# Patient Record
Sex: Male | Born: 1940 | Race: White | Hispanic: No | Marital: Married | Smoking: Former smoker
Health system: Southern US, Community
[De-identification: ages and names within clinical notes are randomized; demographics above are authoritative.]

## PROBLEM LIST (undated history)

## (undated) DIAGNOSIS — L570 Actinic keratosis: Secondary | ICD-10-CM

## (undated) DIAGNOSIS — K219 Gastro-esophageal reflux disease without esophagitis: Secondary | ICD-10-CM

## (undated) DIAGNOSIS — I4891 Unspecified atrial fibrillation: Secondary | ICD-10-CM

## (undated) DIAGNOSIS — M1990 Unspecified osteoarthritis, unspecified site: Secondary | ICD-10-CM

## (undated) DIAGNOSIS — I2510 Atherosclerotic heart disease of native coronary artery without angina pectoris: Secondary | ICD-10-CM

## (undated) DIAGNOSIS — M519 Unspecified thoracic, thoracolumbar and lumbosacral intervertebral disc disorder: Secondary | ICD-10-CM

## (undated) DIAGNOSIS — R519 Headache, unspecified: Secondary | ICD-10-CM

## (undated) DIAGNOSIS — K2100 Gastro-esophageal reflux disease with esophagitis, without bleeding: Secondary | ICD-10-CM

## (undated) DIAGNOSIS — I10 Essential (primary) hypertension: Secondary | ICD-10-CM

## (undated) HISTORY — DX: Actinic keratosis: L57.0

## (undated) HISTORY — PX: AMPUTATION ARM: SHX6593

## (undated) HISTORY — PX: TOTAL HIP ARTHROPLASTY: SHX124

## (undated) HISTORY — PX: CATARACT EXTRACTION: SUR2

## (undated) HISTORY — PX: COLONOSCOPY: SHX174

## (undated) HISTORY — PX: CHOLECYSTECTOMY: SHX55

---

## 1999-09-20 ENCOUNTER — Encounter: Payer: Self-pay | Admitting: Neurology

## 1999-09-20 ENCOUNTER — Encounter: Admission: RE | Admit: 1999-09-20 | Discharge: 1999-12-19 | Payer: Self-pay | Admitting: Neurology

## 1999-09-20 ENCOUNTER — Ambulatory Visit (HOSPITAL_COMMUNITY): Admission: RE | Admit: 1999-09-20 | Discharge: 1999-09-20 | Payer: Self-pay | Admitting: Neurology

## 1999-11-01 ENCOUNTER — Encounter: Payer: Self-pay | Admitting: Neurosurgery

## 1999-11-05 ENCOUNTER — Encounter: Payer: Self-pay | Admitting: Neurosurgery

## 1999-11-05 ENCOUNTER — Ambulatory Visit (HOSPITAL_COMMUNITY): Admission: RE | Admit: 1999-11-05 | Discharge: 1999-11-06 | Payer: Self-pay | Admitting: Neurosurgery

## 2004-12-08 ENCOUNTER — Emergency Department (HOSPITAL_COMMUNITY): Admission: EM | Admit: 2004-12-08 | Discharge: 2004-12-08 | Payer: Self-pay | Admitting: Emergency Medicine

## 2006-02-25 ENCOUNTER — Ambulatory Visit: Payer: Self-pay | Admitting: Gastroenterology

## 2006-04-21 ENCOUNTER — Ambulatory Visit: Payer: Self-pay | Admitting: Internal Medicine

## 2006-11-18 ENCOUNTER — Inpatient Hospital Stay (HOSPITAL_COMMUNITY): Admission: RE | Admit: 2006-11-18 | Discharge: 2006-11-19 | Payer: Self-pay | Admitting: Neurosurgery

## 2007-03-03 ENCOUNTER — Ambulatory Visit: Payer: Self-pay | Admitting: Neurosurgery

## 2007-05-26 ENCOUNTER — Inpatient Hospital Stay (HOSPITAL_COMMUNITY): Admission: RE | Admit: 2007-05-26 | Discharge: 2007-05-29 | Payer: Self-pay | Admitting: Orthopaedic Surgery

## 2007-05-27 ENCOUNTER — Ambulatory Visit: Payer: Self-pay | Admitting: Physical Medicine & Rehabilitation

## 2008-02-23 ENCOUNTER — Encounter: Admission: RE | Admit: 2008-02-23 | Discharge: 2008-02-23 | Payer: Self-pay | Admitting: Neurosurgery

## 2009-06-14 ENCOUNTER — Ambulatory Visit (HOSPITAL_COMMUNITY): Admission: RE | Admit: 2009-06-14 | Discharge: 2009-06-14 | Payer: Self-pay | Admitting: Orthopaedic Surgery

## 2009-07-05 ENCOUNTER — Ambulatory Visit: Payer: Self-pay | Admitting: Gastroenterology

## 2010-08-24 ENCOUNTER — Encounter (HOSPITAL_COMMUNITY): Payer: Medicare Other | Attending: Orthopedic Surgery

## 2010-08-24 LAB — CBC
Platelets: 155 10*3/uL (ref 150–400)
RBC: 3.98 MIL/uL — ABNORMAL LOW (ref 4.22–5.81)
WBC: 4.6 10*3/uL (ref 4.0–10.5)

## 2010-08-24 LAB — BASIC METABOLIC PANEL
BUN: 18 mg/dL (ref 6–23)
Chloride: 104 mEq/L (ref 96–112)
Creatinine, Ser: 0.86 mg/dL (ref 0.4–1.5)
Glucose, Bld: 104 mg/dL — ABNORMAL HIGH (ref 70–99)

## 2010-08-24 LAB — DIFFERENTIAL
Basophils Absolute: 0 10*3/uL (ref 0.0–0.1)
Basophils Relative: 0 % (ref 0–1)
Eosinophils Absolute: 0.1 10*3/uL (ref 0.0–0.7)
Neutrophils Relative %: 57 % (ref 43–77)

## 2010-08-24 LAB — URINALYSIS, ROUTINE W REFLEX MICROSCOPIC
Ketones, ur: NEGATIVE mg/dL
Nitrite: NEGATIVE
Specific Gravity, Urine: 1.021 (ref 1.005–1.030)
Urobilinogen, UA: 0.2 mg/dL (ref 0.0–1.0)
pH: 6.5 (ref 5.0–8.0)

## 2010-08-24 LAB — APTT: aPTT: 33 seconds (ref 24–37)

## 2010-08-24 LAB — PROTIME-INR: INR: 1.11 (ref 0.00–1.49)

## 2010-08-24 LAB — SURGICAL PCR SCREEN: Staphylococcus aureus: NEGATIVE

## 2010-09-04 ENCOUNTER — Inpatient Hospital Stay (HOSPITAL_COMMUNITY): Payer: Medicare Other

## 2010-09-04 ENCOUNTER — Inpatient Hospital Stay (HOSPITAL_COMMUNITY)
Admission: RE | Admit: 2010-09-04 | Discharge: 2010-09-06 | DRG: 468 | Disposition: A | Discharge status: Home Health | Payer: Medicare Other | Attending: Orthopedic Surgery | Admitting: Orthopedic Surgery

## 2010-09-04 DIAGNOSIS — R7989 Other specified abnormal findings of blood chemistry: Secondary | ICD-10-CM | POA: Diagnosis present

## 2010-09-04 DIAGNOSIS — M1610 Unilateral primary osteoarthritis, unspecified hip: Secondary | ICD-10-CM | POA: Diagnosis present

## 2010-09-04 DIAGNOSIS — Y831 Surgical operation with implant of artificial internal device as the cause of abnormal reaction of the patient, or of later complication, without mention of misadventure at the time of the procedure: Secondary | ICD-10-CM | POA: Diagnosis present

## 2010-09-04 DIAGNOSIS — M169 Osteoarthritis of hip, unspecified: Secondary | ICD-10-CM | POA: Diagnosis present

## 2010-09-04 DIAGNOSIS — Y92009 Unspecified place in unspecified non-institutional (private) residence as the place of occurrence of the external cause: Secondary | ICD-10-CM

## 2010-09-04 DIAGNOSIS — T84099A Other mechanical complication of unspecified internal joint prosthesis, initial encounter: Principal | ICD-10-CM | POA: Diagnosis present

## 2010-09-04 DIAGNOSIS — Z96649 Presence of unspecified artificial hip joint: Secondary | ICD-10-CM

## 2010-09-04 DIAGNOSIS — I10 Essential (primary) hypertension: Secondary | ICD-10-CM | POA: Diagnosis present

## 2010-09-04 LAB — TYPE AND SCREEN

## 2010-09-05 LAB — BASIC METABOLIC PANEL
CO2: 25 mEq/L (ref 19–32)
Chloride: 108 mEq/L (ref 96–112)
Creatinine, Ser: 0.78 mg/dL (ref 0.4–1.5)
GFR calc Af Amer: >60 mL/min (ref >60)
Potassium: 3.8 mEq/L (ref 3.5–5.1)
Sodium: 140 mEq/L (ref 135–145)

## 2010-09-05 LAB — CBC
Hemoglobin: 12.2 g/dL — ABNORMAL LOW (ref 13.0–17.0)
MCH: 31.4 pg (ref 26.0–34.0)
Platelets: 157 10*3/uL (ref 150–400)
RBC: 3.88 MIL/uL — ABNORMAL LOW (ref 4.22–5.81)
WBC: 8.6 10*3/uL (ref 4.0–10.5)

## 2010-09-06 LAB — BASIC METABOLIC PANEL
BUN: 15 mg/dL (ref 6–23)
CO2: 27 mEq/L (ref 19–32)
Chloride: 109 mEq/L (ref 96–112)
Creatinine, Ser: 0.95 mg/dL (ref 0.4–1.5)
Glucose, Bld: 118 mg/dL — ABNORMAL HIGH (ref 70–99)
Potassium: 3.9 mEq/L (ref 3.5–5.1)

## 2010-09-06 LAB — CBC
HCT: 32 % — ABNORMAL LOW (ref 39.0–52.0)
Hemoglobin: 11.2 g/dL — ABNORMAL LOW (ref 13.0–17.0)
MCH: 32.2 pg (ref 26.0–34.0)
MCV: 92 fL (ref 78.0–100.0)
RBC: 3.48 MIL/uL — ABNORMAL LOW (ref 4.22–5.81)
WBC: 9.1 10*3/uL (ref 4.0–10.5)

## 2010-09-10 NOTE — Op Note (Signed)
Bradley, Moran               ACCOUNT NO.:  [id number]  MEDICAL RECORD NO.:  [id number]           PATIENT TYPE:  I  LOCATION:  1601                         FACILITY:  [facility], [place]  PHYSICIAN:  Madlyn Frankel. Charlann Boxer, M.D.  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  FINDINGS:  There was evidence of the significant joint effusion.  No signs of infection, however.  There was also some corrosion issues going on with the trunnion.  PROCEDURE:  Revision right total hip replacement utilizing a 58 pinnacle Gription cup, 2 cancellous bone screws of  36 plus 4 10-degrees AltrX liner with a 36 plus 12 aSphere ball.  SURGEON:  Madlyn Frankel. Charlann Boxer, M.D.  ASSISTANT:  Nelia Shi. Webb Silversmith, RN  ANESTHESIA:  General.  ESTIMATED BLOOD LOSS:  300 cc.  SPECIMENS:  None.  DRAINS:  One Hemovac.  INDICATIONS FOR PROCEDURE:  [name] is a 70 year old gentleman who presented for evaluation of right hip pain following a total hip replacement with an ASR component.  Serial lab work had indicated elevating serum cobalt chromium levels.  MRI revealed joint effusion and fluid around the hip joint.  After reviewing with him the concerns of these issues as well as the discomfort in the hip, he wished to at this point proceed with revision hip surgery.  Risks of infection, DVT, component failure, dislocation, need for further surgery were all reviewed and discussed for the benefit of pain relief and removal of the metal-on-metal component.  PROCEDURE IN DETAIL:  The patient was brought to the operative theater. Once adequate anesthesia, preoperative antibiotics, Ancef  administered, the  patient was positioned in the left lateral decubitus position with his right side up.  The right lower extremity was then prepped and draped in a sterile fashion.  A time-out was performed identifying the patient, planned procedure and extremity.  The patient's old incision was identified and marked out.  The incision was made.  Sharp dissection was carried through iliotibial band and gluteal fascia.  This was then incised posteriorly.  Once I got to the iliotibial band and gluteal fascia, I encountered this joint fluid that was just metal stained, there was no sign of inflammation.  No signs of purulence.  Following the soft tissue debridement, synovectomy and removal of the synovial lining, the hip was dislocated and the femoral head removed. Further exposure of the acetabular capsular tissues and allow for exposure allowed me to elevate the femoral head neck trunnion up on to the ilium.  I then used osteotomes from the [facility] cementless revision set to remove the acetabular cup.  The 56-mm cup that was removed, I just touch the acetabular 57 reamer and chose a 58 cup.  The 58 Gription cup was then impacted.  I did end up using a bone tamp to set the orientation and forward flex  beneath the anterior rim and abducted at an appropriate angle.  Once I had this then I used large ball impactor and impacted so the cup was well seated.  Though there was some initial scratch fit, I did rely on 2 iliac screws that were placed and had excellent fixation.  At this point, I did a trial reduction first placing a neutral liner with 36 plus 5 ball.  He had significant shuck at this point with evidence of impingement and early subluxation.  I went ahead and chose a 36 plus 4 10-degrees face changing liner and following placement of a hole eliminator impacted this into position with the lip liner positioned at approximately 9 o'clock for the right hip.  I then retrialed and ended up choosing a  36 plus 12 ball from a revision standpoint to provide as much stability to the hip as possible.  The final 36 plus 12 aSphere ball was chosen and impacted on to a clean and dry trunnion.  At this point, the hip was reduced and even irrigated throughout the case and again at this point.  I reapproximated posterior pseudo capsule to the superior pseudo capsule and then placed a medium Hemovac drain deep.  At this point, the iliotibial band and gluteal fascia were then reapproximated using #1 Vicryl.  The remaining wound was closed with 2-0 Vicryl and running 3-0 Monocryl.  The hip was cleaned, dried, and dressed sterilely with octylseal sealant as well as an Aquacel dressing and drain site was dressed separately.  He was then brought to recovery room in stable condition tolerating the procedure well.     Madlyn Frankel Charlann Boxer, M.D.     MDO/MEDQ  D:  09/04/2010  T:  09/04/2010  Job:  [id number]  Electronically Signed by Durene Romans M.D. on 09/10/2010 10:50:17 AM

## 2010-10-12 NOTE — H&P (Signed)
  [name], FREDELL               ACCOUNT NO.:  [id number]  MEDICAL RECORD NO.:  [id number]           PATIENT TYPE:  I  LOCATION:  PADM                         FACILITY:  [facility]  PHYSICIAN:  Madlyn Frankel. Charlann Boxer, M.D.  DATE OF BIRTH:  01-08-1941  DATE OF ADMISSION:  08/24/2010 DATE OF DISCHARGE:                             HISTORY & PHYSICAL   ADMISSION DIAGNOSIS:  Right hip arthroplasty failure.  BRIEF HISTORY:  This is a patient whom I saw hip, who has failed components and will need revision and is being admitted for such on September 04, 2010.  PAST MEDICAL HISTORY:  Significant for corrective surgery, but he does wear reading glasses.  He has upper dentures.  Otherwise, he is healthy. He has occasional GERD and history of low back pain and osteoarthritis.  CURRENT MEDICATIONS:  Omeprazole daily and Naprosyn as needed.  ALLERGIES:  He has no medicine allergies.  SOCIAL HISTORY:  The patient is married.  He is retired.  He has a history of tobacco use.  No history of alcohol or street drug abuse.  He has 1 children.  DISPOSITION PLAN:  Home.  FAMILY HISTORY:  Father had stroke, died at 78.  His mother died of lung and kidney disease at 71 years old.  REVIEW OF SYSTEMS:  Notable for those difficulties described in history of present illness, past medical history.  His review of systems sheet is otherwise unremarkable.  PHYSICAL EXAMINATION:  VITAL SIGNS:  The patient is 6 feet, 1 inch; 240 pounds.  His blood pressure today is 130/72, his respirations are 18, his pulse is 72. GENERAL:  Health is good. HEENT:  Shows him to be normocephalic with glasses, corrective surgery and upper dentures. CHEST:  Unremarkable.  Clear to auscultation bilaterally. HEART:  S1-S2.  There are no murmurs, rubs, or gallops. ABDOMEN:  Soft and nondistended.  He does have history of GERD. GI/GU:  Otherwise unremarkable. EXTREMITIES:  Osteoarthritis and low back pain. DERMATOLOGIC:  He is  intact. NEUROLOGIC:  He is intact.  LABORATORY DATA:  He just finished his labs, EKG, and chest x-ray at [facility].  IMPRESSION:  Right hip total arthroplasty failure.  PLAN:  Revision on September 04, 2010, with Dr. Charlann Boxer. His discharge medications include Xarelto, Robaxin, MiraLax, Colace were given to him today.  His pain medicines will be given to him at discharge.     [name], RN   ______________________________ Madlyn Frankel Charlann Boxer, M.D.    RLW/MEDQ  D:  08/24/2010  T:  08/24/2010  Job:  [id number]  Electronically Signed by Lauree Chandler NP-C on 08/29/2010 09:44:25 AM Electronically Signed by Durene Romans M.D. on 09/02/2010 09:17:05 AM

## 2010-10-12 NOTE — Discharge Summary (Signed)
  NAMEBRONC, [name]               ACCOUNT NO.:  [id number]  MEDICAL RECORD NO.:  [id number]           PATIENT TYPE:  I  LOCATION:  PADM                         FACILITY:  [facility]  PHYSICIAN:  Madlyn Frankel. Charlann Boxer, M.D.  DATE OF BIRTH:  12-03-1940  DATE OF ADMISSION:  08/24/2010 DATE OF DISCHARGE:  08/24/2010                              DISCHARGE SUMMARY   ADMITTING DIAGNOSIS:  Right hip arthroplasty failure.  BRIEF HISTORY:  The patient was seen in evaluation in February for pain in the right hip due to component failure and decided to proceed with arthroplasty revision.  HOSPITAL COURSE:  The patient was admitted through Same-Day Surgery on the 14th.  He was taken to the operating theater and underwent the revision without any difficulty.  He was taken to the PACU for recovery and brought to [facility] for further recovery and rehabilitation.  Since that time, he has advanced his diet to regular.  He has been up with physical therapy.  He is 25% to 50% weightbearing until followup with Dr. Charlann Boxer.  Today, his vital signs are stable, his labs are stable.  Yesterday, his hemoglobin was 12.2 and hematocrit was 34.6.  His wound is clean and dry.  Discharge instructions were given.  He understands that he is 25% weightbearing with home health physical therapy.  He will be transported home through non-emergent EMS due to stairs at his home.  He has elected to go home instead of a facility for rehabilitation.  DISCHARGE CONDITION:  Good.  DISCHARGE DIAGNOSES: 1. Right hip arthroplasty failure with revision. 2. Corrective eye surgery. 3. Upper dentures. 4. History of gastroesophageal reflux disease. 5. Low back pain.  He is otherwise healthy.  His discharge medications are as follows: 1. Acetaminophen 325 mg as needed. 2. Colace 100 mg as needed. 3. Ferrous sulfate 325 mg 3 times a day for 3 weeks. 4. Dilaudid 2-4 mg p.o. q.4-6 hours p.r.n. pain. 5. Robaxin 500 mg every 6 hours as  needed. 6. MiraLax 17 g a day as needed. 7. Xarelto 10 mg a day for 10 days. 8. Artificial tears as needed. 9. Durezol 0.05% drops in eyes daily. 10.Naprosyn as needed. 11.Nevanac 1% in the left eye 4 times daily. 12.Ofloxacin 0.3% left eye 4 times daily. 13.Omeprazole 20 mg every day as needed.     [name], RN   ______________________________ Madlyn Frankel Charlann Boxer, M.D.    RLW/MEDQ  D:  09/06/2010  T:  09/07/2010  Job:  [id number]  Electronically Signed by Lauree Chandler NP-C on 09/13/2010 01:21:30 PM Electronically Signed by Durene Romans M.D. on 09/14/2010 07:02:09 AM

## 2010-12-04 NOTE — Op Note (Signed)
NAMEFRIEDRICH, [name]               ACCOUNT NO.:  [id number]   MEDICAL RECORD NO.:  [id number]          PATIENT TYPE:  INP   LOCATION:  2899                         FACILITY:  [facility]   PHYSICIAN:  Lubertha Basque. Dalldorf, M.D.DATE OF BIRTH:  03-18-1941   DATE OF PROCEDURE:  05/26/2007  DATE OF DISCHARGE:                               OPERATIVE REPORT   PREOPERATIVE DIAGNOSIS:  Right hip degenerative arthritis.   POSTOPERATIVE DIAGNOSIS:  Right hip degenerative arthritis.   PROCEDURE:  Right total hip replacement.   ANESTHESIA:  General.   ATTENDING SURGEON:  Lubertha Basque. Jerl Santos, M.D.   ASSISTANT:  Lindwood Qua, P.A.-C.   INDICATIONS FOR PROCEDURE:  The patient is a 70 year old male with a  long history of degenerative arthritis of his hips.  He has failed many  oral anti-inflammatories and walking appliances.  He is status post an  opposite side hip replacement years ago which was complicated by  dislocations and required some revision operations.  On the right, he  has pain which limits his ability to rest and walk and he is offered a  replacement with bone-on-bone degeneration seen on the x-rays.  Informed  operative consent was obtained after a discussion of the possible  complications of reaction to anesthesia, infection, DVT, PE,  dislocation, and death.   SUMMARY OF FINDINGS AND PROCEDURE:  Under general anesthesia through a  posterior approach, a right total hip replacement was performed.  He had  advanced degenerative change but excellent bone quality.  We addressed  this problem with a size 7 high offset Summit porous coated DePuy stem  with a +2 size 49 ASR head.  In the cup, we placed a size 56 ASR shell.  Bryna Colander assisted throughout and was invaluable to the completion  of the case in that he helped position and retract while I performed  procedure.  He also closed simultaneously to help minimize OR time.   DESCRIPTION OF PROCEDURE:  The patient was taken to the  operating suite  where a general anesthetic was applied without difficulty.  He was  positioned in the lateral decubitus position with the right hip up.  All  bony prominences were appropriately padded and an axillary roll was  placed and the hip positioners utilized.  He was prepped and draped in a  normal sterile fashion.  After the administration of IV Kefzol, a  posterior approach was taken to the right hip.  All appropriate anti-  infective measures were used including the preoperative IV antibiotic,  Betadine impregnated drape, and closed hooded exhaust systems for each  member of the surgical team.  An incision was made with the dissection  through a generous amount of adipose tissue to expose the IT band and  gluteus maximus fascia.  These structures were incised longitudinally to  expose the short external rotators of the hip which were tagged and  reflected.  A posterior capsulectomy was performed and the hip was  dislocated.  A femoral neck cut was made just above the lesser  trochanter.  The acetabulum was exposed with Cobra retractors.  Some  residual labral tissues were removed.  The reaming was taken medially to  the inside wall of the pelvis and sequentially brought up to size 55  followed by placement of a size 56 ASR shell in appropriate anteversion  and tilt.  The femur was then exposed.  The canal was reamed followed by  sequential broaching up to a size 7 which seemed to fit the best to give  [place] good rotational control and proximal tilt.  We then capped this with  a +2, 49 high offset assembly and the hip was stable in extension with  external rotation and flexion with internal rotation.  Leg lengths were  also judged to be roughly equal.  The trial components were removed  followed by placement of a size 7 high offset Summit stem capped with a  +2, 49 ASR ball.  The hip was reduced and, again, was stable.  The wound  was irrigated followed by reapproximation of the  short external rotators  to the greater trochanteric region with nonabsorbable suture.  The IT  band and gluteus maximus fascia were reapproximated with #1 Vicryl in an  interrupted fashion followed by subcutaneous reapproximation with 0 and  2-0 undyed Vicryl and skin closure with staples.  Adaptic was applied  followed by dry gauze and tape.  Estimated blood loss and intraoperative  fluids can be obtained from anesthesia records.   DISPOSITION:  The patient was extubated in operating room and taken to  the recovery room in stable addition.  He was to be admitted to the  orthopedic surgery service for appropriate postop care to include  perioperative antibiotics and Coumadin plus Lovenox for DVT prophylaxis.      Lubertha Basque Jerl Santos, M.D.  Electronically Signed     PGD/MEDQ  D:  05/26/2007  T:  05/26/2007  Job:  [id number]
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
## 2010-12-07 NOTE — Op Note (Signed)
NAMEWILFRED, [name]               ACCOUNT NO.:  [id number]   MEDICAL RECORD NO.:  [id number]          PATIENT TYPE:  INP   LOCATION:  2899                         FACILITY:  [facility]   PHYSICIAN:  Payton Doughty, M.D.      DATE OF BIRTH:  1941/07/05   DATE OF PROCEDURE:  11/18/2006  DATE OF DISCHARGE:                               OPERATIVE REPORT   PREOPERATIVE DIAGNOSIS:  Spondylosis, L3-4.   POSTOPERATIVE DIAGNOSIS:  Spondylosis, L3-4.   PROCEDURE:  L3-4 laminotomy, foraminotomy done bilaterally.   SURGEON:  Payton Doughty, M.D.   SERVICE:  Neurosurgery.   ANESTHESIA:  General endotracheal   PREP:  Betadine prep with alcohol wipe.   COMPLICATIONS:  None.   NURSE ASSISTANT:  Covington.   DOCTOR ASSISTANT:  Nudelman.   BODY OF TEXT:  This is a 70 year old gentleman with neurogenic  claudication taken to operative room and smoothly anesthetized and  intubated, placed prone on the operating table following shave, prep,  and drape in usual sterile fashion.  The skin was then prepared with 1%  lidocaine with 1:400,000 epinephrine.  The skin was incised over the L3  lamina which was dissected free.  Intraoperative x-ray confirmed  correctness level.  The McCullough self-retaining retractor was placed.  Using a high-speed drill and Kerrison, laminotomy and foraminotomy were  carried out bilaterally of L3.  On the left side and the right side,  there was abundant redundant ligamentum flavum with significant lateral  recess narrowing.  Following complete decompression and exploration of  the neural foramen and the canal, the wound was irrigated.  Hemostasis  assured.  The laminotomy defects covered with Depo-Medrol soaked fat.  Successive layers of 0 Vicryl, 2-0 Vicryl, and 3-0 nylon were used to  close.  Betadine and Telfa dressing were applied, and the patient  returned to recovery room in good condition.           ______________________________  Payton Doughty, M.D.     MWR/MEDQ  D:  11/18/2006  T:  11/18/2006  Job:  [id number]

## 2010-12-07 NOTE — Op Note (Signed)
[place]  Patient:    [name], [name]                       MRN: [id number] Proc. Date: 11/05/99 Attending:  Julio Sicks, M.D.                           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  PROCEDURE:  Right T12 transpedicular microdiskectomy.  SURGEON:  Julio Sicks, M.D.  ASSISTANT:  Reinaldo Meeker, M.D.  ANESTHESIA:  General oral endotracheal.  INDICATIONS FOR PROCEDURE:  The patient is a 70 year old  male with a history of upper lumbar and bilateral lower extremity pain, paresthesia and progressive weakness and incoordination consistent with a lower thoracic myelopathy.  MRI scan demonstrated a large central T11-12 disk herniation with compression of the spinal cord at that level.  The disk herniation is slightly toward the right.  The patient has been counseled as to his options and has decided to proceed with a right sided T11-12 transpedicular microdiskectomy for hopeful relief of his symptoms.  DESCRIPTION OF PROCEDURE:  The patient was brought to the operating room and place on the table in the supine position.  After an adequate level of anesthesia was  achieved, the patient was positioned prone on the Wilson frame and appropriately padded.  The patients lumbar region was shaved and prepped sterilely.  A #10 blade was used to make a linear skin incision overlying the T11-12 region.  This was carried down sharply in the midline.  A subperiosteal dissection was performed n the left side including the lamina, facet joints and rudimentary transverse processes of T11 and T12 as well as the proximal rim head of T12.  X-rays were taken and the level was confirmed.  A laminotomy and complete facetectomy was then performed on the right side at T11-12 using the high speed drill and  Kerrison rongeurs.  The pedicle of T12 was then removed using the high speed drill down o the level of the vertebral body of T12.  The microscope was brought onto the field and used for microdissection.  After the venous plexus was coagulated and cut, he exiting T11 nerve root was identified.  The lateral aspect of the disk space was then incised with a #11 blade in a rectangular fashion.  A gentle clean out of he interspace was then performed using pituitary rongeurs.  There was no traction placed on the thecal sac at any time during the procedure.  Blunt probes were then used to gradually decompress the spinal cord by removing in a piece meal fashion a large amount of free disk herniation at this level.  With time, the complete disk herniation appeared to deliver itself and was removed using pituitary rongeurs nd suction.  At this point, a blunt probe was passed easily beneath the thecal sac ad along the course of the nerve root.  There was no evidence of any continued compression.  There was no evidence of loose or degenerative material within the interspace.  The wound was then copiously irrigated with antibiotic solution. Gelfoam was placed postoperatively for hemostasis, which was found to be good. The microscope and retractors were removed.  Hemostasis in the muscle was achieved ith electrocautery.  The wound was then closed in layers with Vicryl suture. Staples were applied to the surface.  There were no apparent operative complications. T tolerated the procedure well and returns to the recovery room postoperatively. DD:  11/05/99 TD:  11/05/99 Job: [id number]

## 2010-12-07 NOTE — H&P (Signed)
[name], [name]               ACCOUNT NO.:  [id number]   MEDICAL RECORD NO.:  [id number]          PATIENT TYPE:  INP   LOCATION:  2899                         FACILITY:  [facility]   PHYSICIAN:  Payton Doughty, M.D.      DATE OF BIRTH:  [birth year]   DATE OF ADMISSION:  11/18/2006  DATE OF DISCHARGE:                              HISTORY & PHYSICAL   ADMITTING DIAGNOSIS:  Lumbar spondylosis, neurogenic claudication.   DICTATING DOCTOR:  Dr. Trey Sailors.   SERVICES:  Neurosurgery.   Very nice 70 year old left-handed, white gentleman who has had back pain  off and on for a bit over the past couple of months.  It has been  getting worse.  Trouble getting up and down.  Pain into his legs with  numbness in great toe in the right foot.  He had an epidural steroid  injection done a couple of years ago.  Historically, he had a T12-T11  compression by Dr. Jordan Likes in 2000.  He had a hip replacement in 1998, had  a cholecystectomy in the remote past.   MEDICATIONS:  Are hydrochlorothiazide, Protonix, Vicodin and iron.   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   SOCIAL HISTORY:  He does not smoke.  Does not drink and is retired from  the Research scientist (life sciences) parts business.   FAMILY HISTORY:  Mom died at 61 from pulmonary fibrosis.  Dad died at 51  of stroke.   REVIEW OF SYSTEMS:  Remarkable for wearing glasses, hypertension, back  pain, leg pain and arthritis.   HEENT EXAM:  Within normal limits.  He has reasonable range of motion in  the neck.  CHEST:  Clear.  CARDIAC EXAM:  Regular rate and rhythm.  ABDOMEN:  Nontender.  No hepatosplenomegaly.  EXTREMITIES:  Without clubbing or cyanosis, although the right upper is  missing.  GU EXAM:  Deferred.  PERIPHERAL PULSES:  Good.  NEUROLOGICALLY:  He is awake, alert and oriented.  His cranial nerves  are intact.  Motor exam shows 5/5 strength throughout the left upper  extremities and lower extremities.  Sensory is  described as an L4-L5 and  S1 distribution bilaterally, slightly worse on the right than on the  left.  Reflexes are 1 at the knees and ankles.  Toes are downgoing  bilaterally.   MR demonstrates lumbar spondylosis with significant stenosis at L3-4,  bilateral recess narrowing in an 8 mm canal.  There is a lesser degree  of stenosis at L4-5.   CLINICAL IMPRESSION:  Lumbar spondylosis causing neurogenic  claudication.   The plan is for a bilateral laminotomy foraminotomy at L3-4.  The risks  and benefits of this approach have been discussed with him and he wishes  to proceed.           ______________________________  Payton Doughty, M.D.     MWR/MEDQ  D:  11/18/2006  T:  11/18/2006  Job:  [id number]

## 2011-04-30 LAB — BASIC METABOLIC PANEL
BUN: 10
BUN: 8
CO2: 29
CO2: 31
Calcium: 8.4
Calcium: 8.6
Chloride: 93 — ABNORMAL LOW
Chloride: 98
Creatinine, Ser: 0.68
Creatinine, Ser: 0.69
Creatinine, Ser: 0.75
GFR calc Af Amer: >60
GFR calc non Af Amer: >60
Glucose, Bld: 127 — ABNORMAL HIGH
Glucose, Bld: 131 — ABNORMAL HIGH
Glucose, Bld: 145 — ABNORMAL HIGH
Potassium: 3.5

## 2011-04-30 LAB — CBC
HCT: 31.5 — ABNORMAL LOW
MCHC: 35.2
MCHC: 35.8
MCV: 91.2
MCV: 93.7
Platelets: 172
Platelets: 194
RBC: 3.62 — ABNORMAL LOW
RDW: 12.7
RDW: 12.8
RDW: 12.9

## 2011-04-30 LAB — PROTIME-INR
INR: 1.3
INR: 1.5
Prothrombin Time: 16.1 — ABNORMAL HIGH
Prothrombin Time: 18.8 — ABNORMAL HIGH
Prothrombin Time: 19.8 — ABNORMAL HIGH

## 2011-05-01 LAB — BASIC METABOLIC PANEL
CO2: 29
Calcium: 9.6
Chloride: 101
Creatinine, Ser: 0.76
Glucose, Bld: 103 — ABNORMAL HIGH
Sodium: 137

## 2011-05-01 LAB — CBC
Hemoglobin: 14.1
MCHC: 34.6
MCV: 92.5
RDW: 13

## 2014-06-30 ENCOUNTER — Ambulatory Visit: Payer: Self-pay | Admitting: Gastroenterology

## 2016-04-01 DIAGNOSIS — I10 Essential (primary) hypertension: Secondary | ICD-10-CM | POA: Insufficient documentation

## 2016-05-03 ENCOUNTER — Other Ambulatory Visit (INDEPENDENT_AMBULATORY_CARE_PROVIDER_SITE_OTHER): Payer: Self-pay | Admitting: Vascular Surgery

## 2016-05-03 ENCOUNTER — Encounter (INDEPENDENT_AMBULATORY_CARE_PROVIDER_SITE_OTHER): Payer: Self-pay

## 2016-05-03 ENCOUNTER — Ambulatory Visit (INDEPENDENT_AMBULATORY_CARE_PROVIDER_SITE_OTHER): Payer: Medicare Other | Admitting: Vascular Surgery

## 2016-05-03 ENCOUNTER — Encounter (INDEPENDENT_AMBULATORY_CARE_PROVIDER_SITE_OTHER): Payer: Self-pay | Admitting: Vascular Surgery

## 2016-05-03 VITALS — BP 121/73 | HR 56 | Resp 15 | Ht 71.0 in | Wt 240.0 lb

## 2016-05-03 DIAGNOSIS — I8311 Varicose veins of right lower extremity with inflammation: Secondary | ICD-10-CM | POA: Diagnosis not present

## 2016-05-03 DIAGNOSIS — I8312 Varicose veins of left lower extremity with inflammation: Secondary | ICD-10-CM | POA: Diagnosis not present

## 2016-05-03 DIAGNOSIS — I83819 Varicose veins of unspecified lower extremities with pain: Secondary | ICD-10-CM | POA: Insufficient documentation

## 2016-05-03 DIAGNOSIS — I83813 Varicose veins of bilateral lower extremities with pain: Secondary | ICD-10-CM | POA: Insufficient documentation

## 2016-05-03 NOTE — Assessment & Plan Note (Signed)
Laser ablation for incompetent greater saphenous vein plan bilaterally

## 2016-05-03 NOTE — Progress Notes (Signed)
    [name] is a 75 y.o. 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 He has already undergone right lower extremity laser ablation with good results  No past medical history on file.  No past surgical history on file.   Current Outpatient Prescriptions:  .  ALPRAZolam (XANAX) 0.5 MG tablet, , Disp: , Rfl:  .  hydrochlorothiazide (HYDRODIURIL) 25 MG tablet, , Disp: , Rfl:  .  naproxen sodium (ANAPROX) 220 MG tablet, Take by mouth., Disp: , Rfl:  .  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 The ultrasound machine was used to visualize the saphenous vein throughout its course. A segment in the upper calf was selected for access. The saphenous vein was accessed with minimal difficulty using ultrasound guidance with a micropuncture needle. A 0.018 wire was then placed beyond the saphenofemoral junction and the needle was removed. The 65 cm sheath was then placed over the wire and the wire and dilator were removed. The laser fiber was then placed through the sheath and its tip was placed approximately 4-5 centimeters below the saphenofemoral junction. Tumescent anesthesia was then created with a dilute lidocaine solution. Laser energy was then delivered with constant withdrawal of the sheath and laser fiber. Approximately 1583 Joules of energy were delivered over a length of 41 centimeters using a 1470 Hz VenaCure machine at 7 W. Sterile dressings were placed. The patient tolerated the procedure well without obvious complications.   Follow-up in 1 week with post-laser duplex and three weeks with provider.

## 2016-05-07 ENCOUNTER — Ambulatory Visit (INDEPENDENT_AMBULATORY_CARE_PROVIDER_SITE_OTHER): Payer: Medicare Other

## 2016-05-07 DIAGNOSIS — I8312 Varicose veins of left lower extremity with inflammation: Secondary | ICD-10-CM

## 2016-05-07 DIAGNOSIS — I8311 Varicose veins of right lower extremity with inflammation: Secondary | ICD-10-CM

## 2016-05-28 ENCOUNTER — Encounter (INDEPENDENT_AMBULATORY_CARE_PROVIDER_SITE_OTHER): Payer: Self-pay | Admitting: Vascular Surgery

## 2016-05-28 ENCOUNTER — Ambulatory Visit (INDEPENDENT_AMBULATORY_CARE_PROVIDER_SITE_OTHER): Payer: Medicare Other | Admitting: Vascular Surgery

## 2016-05-28 VITALS — BP 134/73 | HR 55 | Resp 16 | Wt 238.0 lb

## 2016-05-28 DIAGNOSIS — I83813 Varicose veins of bilateral lower extremities with pain: Secondary | ICD-10-CM

## 2016-05-28 NOTE — Assessment & Plan Note (Signed)
The patient has undergone successful laser ablation treatments of both lower extremities. His residual varicosities are much smaller than they were to begin with, and are not that bothersome to him at this point. We discussed sclerotherapy or foam sclerotherapy for the treatment of residual varicosities and at this point he says they are not symptomatic enough to warrant this. He will follow-up as needed and call our office if his symptoms worsen in the future.

## 2016-05-28 NOTE — Progress Notes (Signed)
MRN : [id number]  [name] is a 75 y.o. (August 20, 1940) male who presents with chief complaint of  Chief Complaint  Patient presents with  . Follow-up  .  History of Present Illness: Patient returns today in follow up of Venous insufficiency. The patient has undergone bilateral great saphenous vein laser ablations with good results. He reports decrease in the size of the residual varicosities and they are no longer painful. He had no periprocedural complications. He had successful ablations on duplex.  Current Outpatient Prescriptions  Medication Sig Dispense Refill  . ALPRAZolam (XANAX) 0.5 MG tablet     . hydrochlorothiazide (HYDRODIURIL) 25 MG tablet     . naproxen sodium (ANAPROX) 220 MG tablet Take by mouth.    Marland Kitchen omeprazole (PRILOSEC) 20 MG capsule      No current facility-administered medications for this visit.     No past medical history on file.  No past surgical history on file.  Social History Social History  Substance Use Topics  . Smoking status: Former Research scientist (life sciences)  . Smokeless tobacco: Never Used  . Alcohol use No      Family History No family history on file. No bleeding or clotting disorders known  No Known Allergies   REVIEW OF SYSTEMS (Negative unless checked)  Constitutional: [] Weight loss  [] Fever  [] Chills Cardiac: [] Chest pain   [] Chest pressure   [] Palpitations   [] Shortness of breath when laying flat   [] Shortness of breath at rest   [] Shortness of breath with exertion. Vascular:  [] Pain in legs with walking   [] Pain in legs at rest   [] Pain in legs when laying flat   [] Claudication   [] Pain in feet when walking  [] Pain in feet at rest  [] Pain in feet when laying flat   [] History of DVT   [] Phlebitis   [x] Swelling in legs   [x] Varicose veins   [] Non-healing ulcers Pulmonary:   [] Uses home oxygen   [] Productive cough   [] Hemoptysis   [] Wheeze  [] COPD   [] Asthma Neurologic:  [] Dizziness  [] Blackouts   [] Seizures   [] History of stroke   [] History  of TIA  [] Aphasia   [] Temporary blindness   [] Dysphagia   [] Weakness or numbness in arms   [] Weakness or numbness in legs Musculoskeletal:  [] Arthritis   [] Joint swelling   [] Joint pain   [] Low back pain Hematologic:  [] Easy bruising  [] Easy bleeding   [] Hypercoagulable state   [] Anemic   Gastrointestinal:  [] Blood in stool   [] Vomiting blood  [] Gastroesophageal reflux/heartburn   [] Abdominal pain Genitourinary:  [] Chronic kidney disease   [] Difficult urination  [] Frequent urination  [] Burning with urination   [] Hematuria Skin:  [] Rashes   [] Ulcers   [] Wounds Psychological:  [] History of anxiety   []  History of major depression.  Physical Examination  BP 134/73   Pulse (!) 55   Resp 16   Wt 238 lb (108 kg)   BMI 33.19 kg/m  Gen:  WD/WN, NAD Head: [place]/AT, No temporalis wasting. Ear/Nose/Throat: Hearing grossly intact, nares w/o erythema or drainage, trachea midline Eyes: Conjunctiva clear. Sclera non-icteric Neck: Supple.  No JVD.  Pulmonary:  Good air movement, no use of accessory muscles.  Cardiac: RRR, normal S1, S2 Vascular:  Vessel Right Left  Radial Palpable Palpable  Ulnar Palpable Palpable  Brachial Palpable Palpable  Carotid Palpable, without bruit Palpable, without bruit  Aorta Not palpable N/A  Femoral Palpable Palpable  Popliteal Palpable Palpable  PT Palpable Palpable  DP Palpable Palpable  Gastrointestinal: soft, non-tender/non-distended. No guarding/reflex.  Musculoskeletal: Right arm amputation.  No deformity or atrophy. Trace bilateral lower extremity edema. Diffuse 3-4 mm varicosities a little worse on the left leg from the right. Neurologic: Sensation grossly intact in extremities.  Symmetrical.  Speech is fluent.  Psychiatric: Judgment intact, Mood & affect appropriate for pt's clinical situation. Dermatologic: No rashes or ulcers noted.  No cellulitis or open wounds. Lymph : No Cervical, Axillary, or Inguinal lymphadenopathy.      Labs No results  found for this or any previous visit (from the past 2160 hour(s)).  Radiology none  Assessment/Plan  Varicose veins of bilateral lower extremities with pain The patient has undergone successful laser ablation treatments of both lower extremities. His residual varicosities are much smaller than they were to begin with, and are not that bothersome to him at this point. We discussed sclerotherapy or foam sclerotherapy for the treatment of residual varicosities and at this point he says they are not symptomatic enough to warrant this. He will follow-up as needed and call our office if his symptoms worsen in the future.    Leotis Pain, MD  05/28/2016 10:40 AM    This note was created with Dragon medical transcription system.  Any errors from dictation are purely unintentional

## 2016-12-11 ENCOUNTER — Other Ambulatory Visit: Payer: Self-pay | Admitting: Internal Medicine

## 2016-12-11 DIAGNOSIS — G9349 Other encephalopathy: Secondary | ICD-10-CM

## 2016-12-11 DIAGNOSIS — R430 Anosmia: Secondary | ICD-10-CM

## 2016-12-19 ENCOUNTER — Ambulatory Visit: Payer: No Typology Code available for payment source

## 2016-12-24 ENCOUNTER — Ambulatory Visit
Admission: RE | Admit: 2016-12-24 | Discharge: 2016-12-24 | Disposition: A | Discharge status: Home / Self Care | Payer: Medicare Other | Source: Ambulatory Visit | Attending: Internal Medicine | Admitting: Internal Medicine

## 2016-12-24 DIAGNOSIS — G9349 Other encephalopathy: Secondary | ICD-10-CM | POA: Insufficient documentation

## 2016-12-24 DIAGNOSIS — R430 Anosmia: Secondary | ICD-10-CM

## 2017-04-02 DIAGNOSIS — M4145 Neuromuscular scoliosis, thoracolumbar region: Secondary | ICD-10-CM | POA: Insufficient documentation

## 2017-04-02 DIAGNOSIS — M4140 Neuromuscular scoliosis, site unspecified: Secondary | ICD-10-CM | POA: Insufficient documentation

## 2018-08-03 DIAGNOSIS — Z0000 Encounter for general adult medical examination without abnormal findings: Secondary | ICD-10-CM | POA: Insufficient documentation

## 2018-08-03 DIAGNOSIS — S48911A Complete traumatic amputation of right shoulder and upper arm, level unspecified, initial encounter: Secondary | ICD-10-CM | POA: Insufficient documentation

## 2018-12-18 DIAGNOSIS — M25562 Pain in left knee: Secondary | ICD-10-CM | POA: Insufficient documentation

## 2018-12-23 ENCOUNTER — Other Ambulatory Visit: Payer: Self-pay | Admitting: Orthopedic Surgery

## 2018-12-23 ENCOUNTER — Other Ambulatory Visit
Admission: RE | Admit: 2018-12-23 | Discharge: 2018-12-23 | Disposition: A | Discharge status: Home / Self Care | Payer: Medicare HMO | Source: Ambulatory Visit | Attending: Orthopedic Surgery | Admitting: Orthopedic Surgery

## 2018-12-23 DIAGNOSIS — Z96641 Presence of right artificial hip joint: Secondary | ICD-10-CM

## 2018-12-23 DIAGNOSIS — Z96643 Presence of artificial hip joint, bilateral: Secondary | ICD-10-CM | POA: Diagnosis present

## 2018-12-23 DIAGNOSIS — Z96642 Presence of left artificial hip joint: Secondary | ICD-10-CM

## 2018-12-23 LAB — CBC WITH DIFFERENTIAL/PLATELET
Abs Immature Granulocytes: 0.01 10*3/uL (ref 0.00–0.07)
Basophils Absolute: 0 10*3/uL (ref 0.0–0.1)
Basophils Relative: 1 %
Eosinophils Absolute: 0.1 10*3/uL (ref 0.0–0.5)
Eosinophils Relative: 2 %
HCT: 40.8 % (ref 39.0–52.0)
Hemoglobin: 13.8 g/dL (ref 13.0–17.0)
Immature Granulocytes: 0 %
Lymphocytes Relative: 28 %
Lymphs Abs: 1.6 10*3/uL (ref 0.7–4.0)
MCH: 32.2 pg (ref 26.0–34.0)
MCHC: 33.8 g/dL (ref 30.0–36.0)
MCV: 95.3 fL (ref 80.0–100.0)
Monocytes Absolute: 0.5 10*3/uL (ref 0.1–1.0)
Monocytes Relative: 9 %
Neutro Abs: 3.4 10*3/uL (ref 1.7–7.7)
Neutrophils Relative %: 60 %
Platelets: 161 10*3/uL (ref 150–400)
RBC: 4.28 MIL/uL (ref 4.22–5.81)
RDW: 12.6 % (ref 11.5–15.5)
WBC: 5.7 10*3/uL (ref 4.0–10.5)
nRBC: 0 % (ref 0.0–0.2)

## 2018-12-23 LAB — SEDIMENTATION RATE: Sed Rate: 8 mm/hr (ref 0–20)

## 2018-12-23 LAB — C-REACTIVE PROTEIN: CRP: 0.9 mg/dL (ref <1.0)

## 2019-01-01 ENCOUNTER — Ambulatory Visit
Admission: RE | Admit: 2019-01-01 | Discharge: 2019-01-01 | Disposition: A | Discharge status: Home / Self Care | Payer: Medicare HMO | Source: Ambulatory Visit | Attending: Orthopedic Surgery | Admitting: Orthopedic Surgery

## 2019-01-01 ENCOUNTER — Other Ambulatory Visit: Payer: Self-pay

## 2019-01-01 ENCOUNTER — Encounter
Admission: RE | Admit: 2019-01-01 | Discharge: 2019-01-01 | Disposition: A | Discharge status: Home / Self Care | Payer: Medicare HMO | Source: Ambulatory Visit | Attending: Orthopedic Surgery | Admitting: Orthopedic Surgery

## 2019-01-01 DIAGNOSIS — Z96641 Presence of right artificial hip joint: Secondary | ICD-10-CM

## 2019-01-01 DIAGNOSIS — Z96642 Presence of left artificial hip joint: Secondary | ICD-10-CM | POA: Insufficient documentation

## 2019-01-01 MED ORDER — TECHNETIUM TC 99M MEDRONATE IV KIT
22.9300 | PACK | Freq: Once | INTRAVENOUS | Status: AC | PRN
  Administered 2019-01-01: 11:00:00 22.93 via INTRAVENOUS

## 2020-01-10 ENCOUNTER — Ambulatory Visit: Payer: No Typology Code available for payment source | Admitting: Dermatology

## 2020-03-29 ENCOUNTER — Encounter: Payer: Self-pay | Admitting: Dermatology

## 2020-03-29 ENCOUNTER — Ambulatory Visit (INDEPENDENT_AMBULATORY_CARE_PROVIDER_SITE_OTHER): Payer: Medicare HMO | Admitting: Dermatology

## 2020-03-29 ENCOUNTER — Other Ambulatory Visit: Payer: Self-pay

## 2020-03-29 DIAGNOSIS — L814 Other melanin hyperpigmentation: Secondary | ICD-10-CM

## 2020-03-29 DIAGNOSIS — B353 Tinea pedis: Secondary | ICD-10-CM | POA: Diagnosis not present

## 2020-03-29 DIAGNOSIS — B351 Tinea unguium: Secondary | ICD-10-CM | POA: Diagnosis not present

## 2020-03-29 DIAGNOSIS — D229 Melanocytic nevi, unspecified: Secondary | ICD-10-CM

## 2020-03-29 DIAGNOSIS — L578 Other skin changes due to chronic exposure to nonionizing radiation: Secondary | ICD-10-CM

## 2020-03-29 DIAGNOSIS — Z1283 Encounter for screening for malignant neoplasm of skin: Secondary | ICD-10-CM | POA: Diagnosis not present

## 2020-03-29 DIAGNOSIS — D1800 Hemangioma unspecified site: Secondary | ICD-10-CM

## 2020-03-29 DIAGNOSIS — L739 Follicular disorder, unspecified: Secondary | ICD-10-CM

## 2020-03-29 DIAGNOSIS — L821 Other seborrheic keratosis: Secondary | ICD-10-CM

## 2020-03-29 MED ORDER — CICLOPIROX OLAMINE 0.77 % EX CREA: TOPICAL_CREAM | Freq: Two times a day (BID) | CUTANEOUS | 0 refills | Status: DC

## 2020-03-29 MED ORDER — DOXYCYCLINE MONOHYDRATE 100 MG PO TABS: 100.0000 mg | ORAL_TABLET | Freq: Two times a day (BID) | ORAL | 0 refills | Status: AC

## 2020-03-29 NOTE — Progress Notes (Signed)
Follow-Up Visit   Subjective  [name] is a 79 y.o. male who presents for the following: TBSE.  Patient here for full body skin exam and skin cancer screening. Patient does have an area of concern on his left Scalp.  He has no history of skin cancer.  The following portions of the chart were reviewed this encounter and updated as appropriate:  Tobacco  Allergies  Meds  Problems  Med Hx  Surg Hx  Fam Hx      Review of Systems:  No other skin or systemic complaints except as noted in HPI or Assessment and Plan.  Objective  Well appearing patient in no apparent distress; mood and affect are within normal limits.  A full examination was performed including scalp, head, eyes, ears, nose, lips, neck, chest, axillae, abdomen, back, buttocks, bilateral upper extremities, bilateral lower extremities, hands, feet, fingers, toes, fingernails, and toenails. All findings within normal limits unless otherwise noted below.  Objective  Right Hallux Toe Nail Plate: Nails thickened with subungual debris   Objective  Right Medial Thigh: Follicular-based erythematous papules and pustules.   Objective  B/L Foot: Scaling and maceration web spaces and over distal and lateral soles.   Objective  Right Preauricular Area, Scalp: Erythematous thin papules/macules with gritty scale diffusely over the scalp. Gritty pink papule right preauricular   Assessment & Plan  Onychomycosis Right Hallux Toe Nail Plate  Treatment deferred at this time  Folliculitis Right Medial Thigh  Start Doxycycline BID with food for 10 days  Doxycycline should be taken with food to prevent nausea. Do not lay down for 30 minutes after taking. Be cautious with sun exposure and use good sun protection while on this medication. Pregnant women should not take this medication.    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   Reviewed course of treatment and expected reaction.  Patient advised to expect inflammation and crusting and advised that erosions are possible.  Patient advised to be diligent with sun protection during and after treatment. Handout with details of how to apply medication and what to expect provided.  Patient prefers  5 f/u cream field treatment ([facility]) 5-fluorouracil/calcipotriene cream typically only needs to be used for 7 days on scalp.  A thin layer should be applied twice a day to the treatment areas recommended by your physician.   Use 5 F/U cream for 4 days in front of right ear Sent in to [facility]  Be good with sun protection and return in 1 week after treatment     Lentigines - Scattered tan macules - Discussed due to sun exposure - Benign, observe - Call for any changes  Seborrheic Keratoses - Stuck-on, waxy, tan-brown papules and plaques  - Discussed benign etiology and prognosis. - Observe - Call for any changes  Melanocytic Nevi - Tan-brown and/or pink-flesh-colored symmetric macules and papules - Benign appearing on exam today - Observation - Call clinic for new or changing moles - Recommend daily use of broad spectrum spf 30+ sunscreen to sun-exposed areas.   Hemangiomas - Red papules - Discussed benign nature - Observe - Call for any changes  Actinic Damage - diffuse  scaly erythematous macules with underlying dyspigmentation - Recommend daily broad spectrum sunscreen SPF 30+ to sun-exposed areas, reapply every 2 hours as needed.  - Call for new or changing lesions.  Skin cancer screening performed today.   Return in about 3 weeks (around 04/19/2020) for Efudex recheck  then 1 year for TBSE.  I, Donzetta Kohut, CMA, am acting as scribe for Forest Gleason, MD .  Documentation: I have reviewed the above documentation for accuracy and completeness, and I agree with the above.  Forest Gleason, MD

## 2020-03-29 NOTE — Patient Instructions (Addendum)
Recommend daily broad spectrum sunscreen SPF 30+ to sun-exposed areas, reapply every 2 hours as needed. Call for new or changing lesions. 5-Fluorouracil/Calcipotriene Patient Education   Actinic keratoses are the dry, red scaly spots on the skin caused by sun damage. A portion of these spots can turn into skin cancer with time, and treating them can help prevent development of skin cancer.   Treatment of these spots requires removal of the defective skin cells. There are various ways to remove actinic keratoses, including freezing with liquid nitrogen, treatment with creams, or treatment with a blue light procedure in the office.   5-fluorouracil cream is a topical cream used to treat actinic keratoses. It works by interfering with the growth of abnormal fast-growing skin cells, such as actinic keratoses. These cells peel off and are replaced by healthy ones.   5-fluorouracil/calcipotriene is a combination of the 5-fluorouracil cream with a vitamin D analog cream called calcipotriene. The calcipotriene alone does not treat actinic keratoses. However, when it is combined with 5-fluorouracil, it helps the 5-fluorouracil treat the actinic keratoses much faster so that the same results can be achieved with a much shorter treatment time.  INSTRUCTIONS FOR 5-FLUOROURACIL/CALCIPOTRIENE CREAM:   5-fluorouracil/calcipotriene cream typically only needs to be used for 4-7 days. A thin layer should be applied twice a day to the treatment areas recommended by your physician.   If your physician prescribed you separate tubes of 5-fluourouracil and calcipotriene, apply a thin layer of 5-fluorouracil followed by a thin layer of calcipotriene.   Avoid contact with your eyes, nostrils, and mouth. Do not use 5-fluorouracil/calcipotriene cream on infected or open wounds.   You will develop redness, irritation and some crusting at areas where you have pre-cancer damage/actinic keratoses. IF YOU DEVELOP PAIN, BLEEDING,  OR SIGNIFICANT CRUSTING, STOP THE TREATMENT EARLY - you have already gotten a good response and the actinic keratoses should clear up well.  Wash your hands after applying 5-fluorouracil 5% cream on your skin.   A moisturizer or sunscreen with a minimum SPF 30 should be applied each morning.   Once you have finished the treatment, you can apply a thin layer of Vaseline twice a day to irritated areas to soothe and calm the areas more quickly. If you experience significant discomfort, contact your physician.  For some patients it is necessary to repeat the treatment for best results.  SIDE EFFECTS: When using 5-fluorouracil/calcipotriene cream, you may have mild irritation, such as redness, dryness, swelling, or a mild burning sensation. This usually resolves within 2 weeks. The more actinic keratoses you have, the more redness and inflammation you can expect during treatment. Eye irritation has been reported rarely. If this occurs, please let us know.  If you have any trouble using this cream, please call the office. If you have any other questions about this information, please do not hesitate to ask me before you leave the office.  Instructions for [facility] Medications  One or more of your medications was sent to the [facility] mail order compounding pharmacy. You will receive an email from them and can purchase the medicine through that link. It will then be mailed to your home at the address you confirmed. If for any reason you do not receive an email from them, please check your spam folder. If you still do not find the email, please let us know or call [facility] at [phone]   Doxycycline should be taken with food to prevent nausea. Do not  lay down for 30 minutes after taking. Be cautious with sun exposure and use good sun protection while on this medication. Pregnant women should not take this medication.

## 2020-03-30 ENCOUNTER — Other Ambulatory Visit: Payer: Self-pay

## 2020-04-03 DIAGNOSIS — I4891 Unspecified atrial fibrillation: Secondary | ICD-10-CM | POA: Insufficient documentation

## 2020-04-03 DIAGNOSIS — I4892 Unspecified atrial flutter: Secondary | ICD-10-CM | POA: Insufficient documentation

## 2020-04-17 ENCOUNTER — Encounter: Payer: Self-pay | Admitting: Dermatology

## 2020-04-19 ENCOUNTER — Encounter: Payer: Self-pay | Admitting: Dermatology

## 2020-04-19 ENCOUNTER — Ambulatory Visit: Payer: Medicare HMO | Admitting: Dermatology

## 2020-04-19 ENCOUNTER — Other Ambulatory Visit: Payer: Self-pay

## 2020-04-19 DIAGNOSIS — T148XXA Other injury of unspecified body region, initial encounter: Secondary | ICD-10-CM

## 2020-04-19 DIAGNOSIS — S81001A Unspecified open wound, right knee, initial encounter: Secondary | ICD-10-CM

## 2020-04-19 DIAGNOSIS — L578 Other skin changes due to chronic exposure to nonionizing radiation: Secondary | ICD-10-CM

## 2020-04-19 DIAGNOSIS — B353 Tinea pedis: Secondary | ICD-10-CM

## 2020-04-19 NOTE — Progress Notes (Signed)
   Follow-Up Visit   Subjective  [name] is a 79 y.o. male who presents for the following: Follow-up.  Patient here today for 3 week field treatment follow up to right preauricular and scalp. Patient used 5FU/calcipotriene cream twice daily x 4 days to right preauricular and twice daily to scalp until yesterday.  He was also using ciclopirox twice daily to feet for tinea pedis and feels like they have improved.   The following portions of the chart were reviewed this encounter and updated as appropriate:  Tobacco  Allergies  Meds  Problems  Med Hx  Surg Hx  Fam Hx      Review of Systems:  No other skin or systemic complaints except as noted in HPI or Assessment and Plan.  Objective  Well appearing patient in no apparent distress; mood and affect are within normal limits.  A focused examination was performed including face, scalp, feet. Relevant physical exam findings are noted in the Assessment and Plan.  Objective  Scalp: Erythema and early crusting at the scalp  Erythema and crusting at right preauricular  Objective  Left Foot - Anterior: [place]in distribution scale bilateral soles, improved. Web spaces clear.   Objective  Right Knee: Nearly healed open wound   Assessment & Plan  Actinic skin damage Scalp  Chronic Currently s/p 4 days of 5-Fu/calcipotriene with good reaction  Recommend one more day of treatment with 5FU/calcipotriene at scalp, discontinue at right preauricular  Start vaseline once treatment with 5-Fu/calcipotriene is complete  Recommend daily broad spectrum sunscreen SPF 30+ to sun-exposed areas, reapply every 2 hours as needed. Call for new or changing lesions.   Tinea pedis of both feet Left Foot - Anterior  Improved Continue ciclopirox twice a day for 2-3 more weeks then once a week for prevention.   Other Related Medications ciclopirox (LOPROX) 0.77 % cream  Open wound Right Knee  Recommend wound care with abx  ointment and band aid daily until healed  Return in about 3 months (around 07/19/2020).  Graciella Belton, RMA, am acting as scribe for Forest Gleason, MD .  Documentation: I have reviewed the above documentation for accuracy and completeness, and I agree with the above.  Forest Gleason, MD

## 2020-04-19 NOTE — Patient Instructions (Addendum)
Melanoma ABCDEs  Melanoma is the most dangerous type of skin cancer, and is the leading cause of death from skin disease.  You are more likely to develop melanoma if you:  Have light-colored skin, light-colored eyes, or red or blond hair  Spend a lot of time in the sun  Tan regularly, either outdoors or in a tanning bed  Have had blistering sunburns, especially during childhood  Have a close family member who has had a melanoma  Have atypical moles or large birthmarks  Early detection of melanoma is key since treatment is typically straightforward and cure rates are extremely high if we catch it early.   The first sign of melanoma is often a change in a mole or a new dark spot.  The ABCDE system is a way of remembering the signs of melanoma.  A for asymmetry:  The two halves do not match. B for border:  The edges of the growth are irregular. C for color:  A mixture of colors are present instead of an even brown color. D for diameter:  Melanomas are usually (but not always) greater than 58mm - the size of a pencil eraser. E for evolution:  The spot keeps changing in size, shape, and color.  Please check your skin once per month between visits. You can use a small mirror in front and a large mirror behind you to keep an eye on the back side or your body.   If you see any new or changing lesions before your next follow-up, please call to schedule a visit.  Please continue daily skin protection including broad spectrum sunscreen SPF 30+ to sun-exposed areas, reapplying every 2 hours as needed when you're outdoors.   Continue ciclopirox to feet twice a day for 2-3 more weeks then once a week for prevention.

## 2020-04-20 ENCOUNTER — Other Ambulatory Visit: Payer: Self-pay | Admitting: Dermatology

## 2020-04-20 DIAGNOSIS — B353 Tinea pedis: Secondary | ICD-10-CM

## 2020-06-21 ENCOUNTER — Ambulatory Visit: Payer: Medicare HMO | Admitting: Dermatology

## 2020-06-30 ENCOUNTER — Other Ambulatory Visit: Payer: Self-pay

## 2020-06-30 ENCOUNTER — Ambulatory Visit (INDEPENDENT_AMBULATORY_CARE_PROVIDER_SITE_OTHER): Payer: Medicare HMO | Admitting: Podiatry

## 2020-06-30 ENCOUNTER — Encounter: Payer: Self-pay | Admitting: Podiatry

## 2020-06-30 DIAGNOSIS — M79674 Pain in right toe(s): Secondary | ICD-10-CM | POA: Diagnosis not present

## 2020-06-30 DIAGNOSIS — L989 Disorder of the skin and subcutaneous tissue, unspecified: Secondary | ICD-10-CM

## 2020-06-30 DIAGNOSIS — M79675 Pain in left toe(s): Secondary | ICD-10-CM | POA: Diagnosis not present

## 2020-06-30 DIAGNOSIS — K2100 Gastro-esophageal reflux disease with esophagitis, without bleeding: Secondary | ICD-10-CM | POA: Insufficient documentation

## 2020-06-30 DIAGNOSIS — M519 Unspecified thoracic, thoracolumbar and lumbosacral intervertebral disc disorder: Secondary | ICD-10-CM | POA: Insufficient documentation

## 2020-06-30 DIAGNOSIS — B351 Tinea unguium: Secondary | ICD-10-CM | POA: Diagnosis not present

## 2020-06-30 MED ORDER — GENTAMICIN SULFATE 0.1 % EX CREA: 1.0000 "application " | TOPICAL_CREAM | Freq: Two times a day (BID) | CUTANEOUS | 1 refills | Status: DC

## 2020-07-04 NOTE — Progress Notes (Signed)
   SUBJECTIVE Patient presents to office today complaining of elongated, thickened nails that cause pain while ambulating in shoes.  He is unable to trim his own nails.  Patient states that he has also developed some pain and tenderness to the distal tip of the right second toe.  It is very sore.  He is concerned for possible nail fungus or ingrown toenail.  Patient is here for further evaluation and treatment.  No past medical history on file.  OBJECTIVE General Patient is awake, alert, and oriented x 3 and in no acute distress. Derm Skin is dry and supple bilateral. Negative open lesions or macerations. Remaining integument unremarkable. Nails are tender, long, thickened and dystrophic with subungual debris, consistent with onychomycosis, 1-5 bilateral. No signs of infection noted.  There is some hyperkeratotic preulcerative callus tissue to the distal tip of the right second toe with some subungual debris underlying the nail plate.  This is the area of sensitivity. Vasc  DP and PT pedal pulses palpable bilaterally. Temperature gradient within normal limits.  Neuro Epicritic and protective threshold sensation grossly intact bilaterally.  Musculoskeletal Exam No symptomatic pedal deformities noted bilateral. Muscular strength within normal limits.  ASSESSMENT 1. Onychodystrophic nails 1-5 bilateral with hyperkeratosis of nails.  2. Onychomycosis of nail due to dermatophyte bilateral 3. Pain in foot bilateral 4.  Preulcerative callus distal tip right second toe  PLAN OF CARE 1. Patient evaluated today.  2. Instructed to maintain good pedal hygiene and foot care.  3. Mechanical debridement of nails 1-5 bilaterally performed using a nail nipper. Filed with dremel without incident.  4.  Excisional debridement of the hyperkeratotic preulcerative callus tissue was performed using a tissue nipper without incident or bleeding  5.  Return to clinic in 3 mos.    Edrick Kins, DPM [facility]  Dr. Edrick Kins, [address]                                        [place], [place] [address]                Office [phone]  Fax [phone]

## 2020-09-08 DIAGNOSIS — H903 Sensorineural hearing loss, bilateral: Secondary | ICD-10-CM | POA: Diagnosis not present

## 2020-09-12 DIAGNOSIS — H903 Sensorineural hearing loss, bilateral: Secondary | ICD-10-CM | POA: Diagnosis not present

## 2020-10-04 DIAGNOSIS — M79645 Pain in left finger(s): Secondary | ICD-10-CM | POA: Diagnosis not present

## 2020-10-23 DIAGNOSIS — A4151 Sepsis due to Escherichia coli [E. coli]: Secondary | ICD-10-CM | POA: Diagnosis not present

## 2020-10-23 DIAGNOSIS — N3090 Cystitis, unspecified without hematuria: Secondary | ICD-10-CM | POA: Diagnosis not present

## 2020-11-14 DIAGNOSIS — Z79899 Other long term (current) drug therapy: Secondary | ICD-10-CM | POA: Diagnosis not present

## 2020-11-14 DIAGNOSIS — E538 Deficiency of other specified B group vitamins: Secondary | ICD-10-CM | POA: Diagnosis not present

## 2020-11-14 DIAGNOSIS — R739 Hyperglycemia, unspecified: Secondary | ICD-10-CM | POA: Diagnosis not present

## 2020-11-21 DIAGNOSIS — I4892 Unspecified atrial flutter: Secondary | ICD-10-CM | POA: Diagnosis not present

## 2020-11-21 DIAGNOSIS — R739 Hyperglycemia, unspecified: Secondary | ICD-10-CM | POA: Diagnosis not present

## 2020-11-21 DIAGNOSIS — Z0001 Encounter for general adult medical examination with abnormal findings: Secondary | ICD-10-CM | POA: Diagnosis not present

## 2020-11-21 DIAGNOSIS — Z0000 Encounter for general adult medical examination without abnormal findings: Secondary | ICD-10-CM | POA: Diagnosis not present

## 2020-11-21 DIAGNOSIS — R600 Localized edema: Secondary | ICD-10-CM | POA: Diagnosis not present

## 2020-11-21 DIAGNOSIS — I4891 Unspecified atrial fibrillation: Secondary | ICD-10-CM | POA: Diagnosis not present

## 2020-12-15 DIAGNOSIS — Z96642 Presence of left artificial hip joint: Secondary | ICD-10-CM | POA: Diagnosis not present

## 2020-12-15 DIAGNOSIS — M25552 Pain in left hip: Secondary | ICD-10-CM | POA: Diagnosis not present

## 2021-01-15 DIAGNOSIS — M5136 Other intervertebral disc degeneration, lumbar region: Secondary | ICD-10-CM | POA: Diagnosis not present

## 2021-01-15 DIAGNOSIS — M47816 Spondylosis without myelopathy or radiculopathy, lumbar region: Secondary | ICD-10-CM | POA: Diagnosis not present

## 2021-01-15 DIAGNOSIS — M25552 Pain in left hip: Secondary | ICD-10-CM | POA: Diagnosis not present

## 2021-01-23 DIAGNOSIS — Z96642 Presence of left artificial hip joint: Secondary | ICD-10-CM | POA: Insufficient documentation

## 2021-03-29 ENCOUNTER — Encounter: Payer: Medicare HMO | Admitting: Dermatology

## 2021-04-09 ENCOUNTER — Other Ambulatory Visit: Payer: Self-pay

## 2021-04-09 ENCOUNTER — Emergency Department: Payer: Medicare HMO

## 2021-04-09 ENCOUNTER — Inpatient Hospital Stay
Admission: EM | Admit: 2021-04-09 | Discharge: 2021-04-12 | DRG: 247 | Disposition: A | Discharge status: Home / Self Care | Payer: Medicare HMO | Attending: Internal Medicine | Admitting: Internal Medicine

## 2021-04-09 DIAGNOSIS — I2584 Coronary atherosclerosis due to calcified coronary lesion: Secondary | ICD-10-CM | POA: Diagnosis present

## 2021-04-09 DIAGNOSIS — E785 Hyperlipidemia, unspecified: Secondary | ICD-10-CM | POA: Diagnosis present

## 2021-04-09 DIAGNOSIS — Z79899 Other long term (current) drug therapy: Secondary | ICD-10-CM | POA: Diagnosis not present

## 2021-04-09 DIAGNOSIS — Z96642 Presence of left artificial hip joint: Secondary | ICD-10-CM | POA: Diagnosis present

## 2021-04-09 DIAGNOSIS — Z7982 Long term (current) use of aspirin: Secondary | ICD-10-CM | POA: Diagnosis not present

## 2021-04-09 DIAGNOSIS — R0789 Other chest pain: Secondary | ICD-10-CM | POA: Diagnosis present

## 2021-04-09 DIAGNOSIS — R55 Syncope and collapse: Secondary | ICD-10-CM | POA: Diagnosis present

## 2021-04-09 DIAGNOSIS — I209 Angina pectoris, unspecified: Secondary | ICD-10-CM | POA: Diagnosis not present

## 2021-04-09 DIAGNOSIS — R42 Dizziness and giddiness: Secondary | ICD-10-CM | POA: Diagnosis not present

## 2021-04-09 DIAGNOSIS — Z89201 Acquired absence of right upper limb, unspecified level: Secondary | ICD-10-CM

## 2021-04-09 DIAGNOSIS — I4891 Unspecified atrial fibrillation: Secondary | ICD-10-CM | POA: Diagnosis present

## 2021-04-09 DIAGNOSIS — I2510 Atherosclerotic heart disease of native coronary artery without angina pectoris: Secondary | ICD-10-CM | POA: Diagnosis present

## 2021-04-09 DIAGNOSIS — M519 Unspecified thoracic, thoracolumbar and lumbosacral intervertebral disc disorder: Secondary | ICD-10-CM | POA: Diagnosis present

## 2021-04-09 DIAGNOSIS — R519 Headache, unspecified: Secondary | ICD-10-CM | POA: Diagnosis not present

## 2021-04-09 DIAGNOSIS — I25110 Atherosclerotic heart disease of native coronary artery with unstable angina pectoris: Principal | ICD-10-CM | POA: Diagnosis present

## 2021-04-09 DIAGNOSIS — R000 Tachycardia, unspecified: Secondary | ICD-10-CM | POA: Diagnosis present

## 2021-04-09 DIAGNOSIS — K2100 Gastro-esophageal reflux disease with esophagitis, without bleeding: Secondary | ICD-10-CM | POA: Diagnosis present

## 2021-04-09 DIAGNOSIS — I119 Hypertensive heart disease without heart failure: Secondary | ICD-10-CM | POA: Diagnosis present

## 2021-04-09 DIAGNOSIS — I4892 Unspecified atrial flutter: Secondary | ICD-10-CM | POA: Diagnosis present

## 2021-04-09 DIAGNOSIS — R079 Chest pain, unspecified: Secondary | ICD-10-CM | POA: Diagnosis not present

## 2021-04-09 DIAGNOSIS — Z87891 Personal history of nicotine dependence: Secondary | ICD-10-CM

## 2021-04-09 DIAGNOSIS — E876 Hypokalemia: Secondary | ICD-10-CM | POA: Diagnosis present

## 2021-04-09 DIAGNOSIS — F419 Anxiety disorder, unspecified: Secondary | ICD-10-CM | POA: Diagnosis present

## 2021-04-09 DIAGNOSIS — I517 Cardiomegaly: Secondary | ICD-10-CM | POA: Diagnosis not present

## 2021-04-09 DIAGNOSIS — Z20822 Contact with and (suspected) exposure to covid-19: Secondary | ICD-10-CM | POA: Diagnosis present

## 2021-04-09 DIAGNOSIS — I872 Venous insufficiency (chronic) (peripheral): Secondary | ICD-10-CM | POA: Diagnosis present

## 2021-04-09 DIAGNOSIS — I482 Chronic atrial fibrillation, unspecified: Secondary | ICD-10-CM | POA: Diagnosis present

## 2021-04-09 DIAGNOSIS — S48911D Complete traumatic amputation of right shoulder and upper arm, level unspecified, subsequent encounter: Secondary | ICD-10-CM | POA: Diagnosis not present

## 2021-04-09 DIAGNOSIS — Z743 Need for continuous supervision: Secondary | ICD-10-CM | POA: Diagnosis not present

## 2021-04-09 DIAGNOSIS — S48911A Complete traumatic amputation of right shoulder and upper arm, level unspecified, initial encounter: Secondary | ICD-10-CM

## 2021-04-09 DIAGNOSIS — Z961 Presence of intraocular lens: Secondary | ICD-10-CM | POA: Diagnosis present

## 2021-04-09 HISTORY — DX: Unspecified atrial fibrillation: I48.91

## 2021-04-09 HISTORY — DX: Essential (primary) hypertension: I10

## 2021-04-09 HISTORY — DX: Unspecified thoracic, thoracolumbar and lumbosacral intervertebral disc disorder: M51.9

## 2021-04-09 HISTORY — DX: Gastro-esophageal reflux disease with esophagitis, without bleeding: K21.00

## 2021-04-09 LAB — CBC WITH DIFFERENTIAL/PLATELET
Abs Immature Granulocytes: 0.03 10*3/uL (ref 0.00–0.07)
Basophils Absolute: 0.1 10*3/uL (ref 0.0–0.1)
Basophils Relative: 1 %
Eosinophils Absolute: 0.2 10*3/uL (ref 0.0–0.5)
Eosinophils Relative: 2 %
HCT: 36.9 % — ABNORMAL LOW (ref 39.0–52.0)
Hemoglobin: 13.4 g/dL (ref 13.0–17.0)
Immature Granulocytes: 0 %
Lymphocytes Relative: 38 %
Lymphs Abs: 3.8 10*3/uL (ref 0.7–4.0)
MCH: 33.1 pg (ref 26.0–34.0)
MCHC: 36.3 g/dL — ABNORMAL HIGH (ref 30.0–36.0)
MCV: 91.1 fL (ref 80.0–100.0)
Monocytes Absolute: 0.7 10*3/uL (ref 0.1–1.0)
Monocytes Relative: 7 %
Neutro Abs: 5.1 10*3/uL (ref 1.7–7.7)
Neutrophils Relative %: 52 %
Platelets: 219 10*3/uL (ref 150–400)
RBC: 4.05 MIL/uL — ABNORMAL LOW (ref 4.22–5.81)
RDW: 12.8 % (ref 11.5–15.5)
WBC: 10 10*3/uL (ref 4.0–10.5)
nRBC: 0 % (ref 0.0–0.2)

## 2021-04-09 LAB — COMPREHENSIVE METABOLIC PANEL
ALT: 21 U/L (ref 0–44)
AST: 25 U/L (ref 15–41)
Albumin: 4 g/dL (ref 3.5–5.0)
Alkaline Phosphatase: 52 U/L (ref 38–126)
Anion gap: 14 (ref 5–15)
BUN: 23 mg/dL (ref 8–23)
CO2: 22 mmol/L (ref 22–32)
Calcium: 8.6 mg/dL — ABNORMAL LOW (ref 8.9–10.3)
Chloride: 103 mmol/L (ref 98–111)
Creatinine, Ser: 0.9 mg/dL (ref 0.61–1.24)
GFR, Estimated: >60 mL/min (ref >60)
Glucose, Bld: 124 mg/dL — ABNORMAL HIGH (ref 70–99)
Potassium: 2.9 mmol/L — ABNORMAL LOW (ref 3.5–5.1)
Sodium: 139 mmol/L (ref 135–145)
Total Bilirubin: 1.1 mg/dL (ref 0.3–1.2)
Total Protein: 6.9 g/dL (ref 6.5–8.1)

## 2021-04-09 LAB — TROPONIN I (HIGH SENSITIVITY)
Troponin I (High Sensitivity): 11 ng/L (ref <18)
Troponin I (High Sensitivity): 14 ng/L (ref <18)

## 2021-04-09 MED ORDER — ACETAMINOPHEN 325 MG PO TABS
650.0000 mg | ORAL_TABLET | Freq: Four times a day (QID) | ORAL | Status: DC | PRN
  Administered 2021-04-11: 650 mg via ORAL
  Filled 2021-04-09: qty 2

## 2021-04-09 MED ORDER — ASPIRIN EC 81 MG PO TBEC
81.0000 mg | DELAYED_RELEASE_TABLET | Freq: Every day | ORAL | Status: DC
  Administered 2021-04-10 – 2021-04-11 (×2): 81 mg via ORAL
  Filled 2021-04-09 (×2): qty 1

## 2021-04-09 MED ORDER — ONDANSETRON HCL 4 MG/2ML IJ SOLN
4.0000 mg | Freq: Once | INTRAMUSCULAR | Status: AC
  Administered 2021-04-09: 4 mg via INTRAVENOUS
  Filled 2021-04-09: qty 2

## 2021-04-09 MED ORDER — MELATONIN 5 MG PO TABS: 2.5000 mg | ORAL_TABLET | Freq: Every evening | ORAL | Status: DC | PRN

## 2021-04-09 MED ORDER — POTASSIUM CHLORIDE IN NACL 40-0.9 MEQ/L-% IV SOLN
INTRAVENOUS | Status: AC
  Filled 2021-04-09 (×2): qty 1000

## 2021-04-09 MED ORDER — POLYETHYLENE GLYCOL 3350 17 G PO PACK: 17.0000 g | PACK | Freq: Every day | ORAL | Status: DC | PRN

## 2021-04-09 MED ORDER — MORPHINE SULFATE (PF) 2 MG/ML IV SOLN
2.0000 mg | Freq: Once | INTRAVENOUS | Status: AC
  Administered 2021-04-09: 2 mg via INTRAVENOUS
  Filled 2021-04-09: qty 1

## 2021-04-09 MED ORDER — LACTATED RINGERS IV BOLUS
1000.0000 mL | Freq: Once | INTRAVENOUS | Status: AC
  Administered 2021-04-09: 1000 mL via INTRAVENOUS

## 2021-04-09 MED ORDER — ENOXAPARIN SODIUM 40 MG/0.4ML IJ SOSY
40.0000 mg | PREFILLED_SYRINGE | INTRAMUSCULAR | Status: DC
  Administered 2021-04-09: 40 mg via SUBCUTANEOUS
  Filled 2021-04-09: qty 0.4

## 2021-04-09 MED ORDER — POTASSIUM CHLORIDE CRYS ER 20 MEQ PO TBCR
40.0000 meq | EXTENDED_RELEASE_TABLET | Freq: Once | ORAL | Status: AC
  Administered 2021-04-09: 40 meq via ORAL
  Filled 2021-04-09: qty 2

## 2021-04-09 MED ORDER — PANTOPRAZOLE SODIUM 40 MG PO TBEC
40.0000 mg | DELAYED_RELEASE_TABLET | Freq: Every day | ORAL | Status: DC
  Administered 2021-04-10 – 2021-04-12 (×3): 40 mg via ORAL
  Filled 2021-04-09 (×3): qty 1

## 2021-04-09 MED ORDER — PROCHLORPERAZINE EDISYLATE 10 MG/2ML IJ SOLN
10.0000 mg | Freq: Four times a day (QID) | INTRAMUSCULAR | Status: DC | PRN
  Filled 2021-04-09: qty 2

## 2021-04-09 NOTE — ED Notes (Signed)
Pt continues to c/o severe dizziness and headache.  Sts symptoms started after nitro spray was administered en route.
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
## 2021-04-09 NOTE — ED Provider Notes (Signed)
[facility] Emergency Department Provider Note  ____________________________________________   I have reviewed the triage vital signs and the nursing notes.   HISTORY  Chief Complaint Chest pain   History limited by: Not Limited   HPI [name] is a 80 y.o. male who presents to the emergency department today because of concern for chest pain. Patient was initially on his way to his PCP however while driving there had a syncopal episode thus EMS was called. The patient states that 4 days ago he had chest pain while mowing the lawn. Located in his center upper chest. He describes it as a pain. The patient says that he stopped mowing and the pain went away, however when he started mowing again the pain started again. It did not radiate. He did not have any associated shortness of breath. Did not have any further episodes of chest pain until today when he was getting ready to go to his doctors office to discuss the chest pain he had while mowing. Says the pain came back. Wife states that while she was driving him to the appointment he passed out. EMS gave patient aspirin and nitroglycerin. While it did help his chest pain he now is complaining of severe headache and dizziness.    Records reviewed. Per medical record review patient has a history of GERD. Atrial fibrillation.    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  Prior to Admission medications   Medication Sig  Start Date End Date Taking? Authorizing Provider  aspirin 81 MG EC tablet Take by mouth.    [provider]  azelastine (ASTELIN) 0.1 % nasal spray SMARTSIG:1-2 Spray(s) Both Nares Every 12 Hours PRN 06/02/20   [provider]  ciclopirox (LOPROX) 0.77 % cream APPLY TOPICALLY TWO TIMES A DAY 04/20/20   Moye, [place], MD  gentamicin cream (GARAMYCIN) 0.1 % Apply 1 application topically 2 (two) times daily. 06/30/20   Edrick Kins, DPM  hydrochlorothiazide (HYDRODIURIL) 25 MG tablet  03/18/16   [provider]  naproxen sodium (ANAPROX) 220 MG tablet Take by mouth.    [provider]  omeprazole (PRILOSEC) 20 MG capsule  02/10/16   [provider]  pantoprazole (PROTONIX) 40 MG tablet Take by mouth. 08/05/19 08/04/20  [provider]    Allergies Patient has no known allergies.  No family history on file.  Social History Social History   Tobacco Use   Smoking status: Former   Smokeless tobacco: Never  Substance Use Topics   Alcohol use: No   Drug use: No    Review of Systems Constitutional: No fever/chills Eyes: No visual changes. ENT: No sore throat. Cardiovascular: Positive for chest pain. Respiratory: Denies shortness of breath. Gastrointestinal: No abdominal pain.  No nausea, no vomiting.  No diarrhea.   Genitourinary: Negative for dysuria. Musculoskeletal: Negative for back pain. Skin: Negative for rash. Neurological: Positive for headache, dizziness.   ____________________________________________   PHYSICAL EXAM:  VITAL SIGNS: ED Triage Vitals  Enc Vitals Group  BP 04/09/21 1443 (!) 147/90     Pulse Rate 04/09/21 1443 82     Resp 04/09/21 1443 (!) 30     Temp 04/09/21 1443 (!) 97.5 F (36.4 C)     Temp Source 04/09/21 1443 Oral     SpO2 04/09/21 1440 99 %     Weight 04/09/21 1447 220 lb (99.8 kg)     Height 04/09/21 1447 6' (1.829 m)     Head Circumference --      Peak Flow --      Pain Score 04/09/21  1446 3   Constitutional: Alert and oriented.  Eyes: Conjunctivae are normal.  ENT      Head: Normocephalic and atraumatic.      Nose: No congestion/rhinnorhea.      Mouth/Throat: Mucous membranes are moist.      Neck: No stridor. Hematological/Lymphatic/Immunilogical: No cervical lymphadenopathy. Cardiovascular: Normal rate, regular rhythm.  No murmurs, rubs, or gallops.  Respiratory: Normal respiratory effort without tachypnea nor retractions. Breath sounds are clear and equal bilaterally. No wheezes/rales/rhonchi. Gastrointestinal: Soft and non tender. No rebound. No guarding.  Genitourinary: Deferred Musculoskeletal: Normal range of motion in all extremities. No lower extremity edema. Neurologic:  Normal speech and language. No gross focal neurologic deficits are appreciated.  Skin:  Skin is warm, dry and intact. No rash noted. Psychiatric: Mood and affect are normal. Speech and behavior are normal. Patient exhibits appropriate insight and judgment.  ____________________________________________    LABS (pertinent positives/negatives)  Trop hs 11 CMP na 139, k 2.9, glu 124, cr 0.90 CBC wbc 10.0, hgb 13.4, plt 219  ____________________________________________   EKG  I, Nance Pear, attending physician, personally viewed and interpreted this EKG  EKG Time: 1443 Rate: 83 Rhythm: atrial fibrillation Axis: normal Intervals: qtc 504 QRS: incomplete RBBB ST changes: no st elevation, t wave inversion V1 Impression: abnormal ekg   ____________________________________________    RADIOLOGY  CT head No acute abnormality  CXR Cardiomegaly. No acute abnormality ____________________________________________   PROCEDURES  Procedures  ____________________________________________   INITIAL IMPRESSION / ASSESSMENT AND PLAN / ED COURSE  Pertinent labs & imaging results that were available during my care of the patient were reviewed by me and considered in my  medical decision making (see chart for details).   Patient presented to the emergency department today because of concerns for chest pain as well as suffering a syncopal episode.  The time my exam patient's chest pain had improved although he was complaining of headache and dizziness.  I do think this was likely secondary to nitroglycerin however did get a CT head to evaluate for any acute bleed.  This was negative.  2 troponins were negative.  EKG without concerning arrhythmia.  At this time unclear etiology of the patient's syncope.  Will plan on admission.  Discussed plan with patient.  ____________________________________________   FINAL CLINICAL IMPRESSION(S) / ED DIAGNOSES  Final diagnoses:  Chest pain, unspecified type  Syncope, unspecified syncope type     Note: This dictation was prepared with Dragon dictation. Any transcriptional errors that result from this process are unintentional     Nance Pear, MD 04/09/21 1912

## 2021-04-09 NOTE — ED Notes (Signed)
ED Provider at bedside. 

## 2021-04-09 NOTE — ED Notes (Signed)
Patient transported to CT 

## 2021-04-09 NOTE — ED Notes (Signed)
XR at bedside

## 2021-04-09 NOTE — ED Triage Notes (Signed)
Per EMS, pt has been having chest pain that comes and goes that started on 09/15. EMS gave pt '324mg'$  of aspirin and 1 spray of nitro. Wife states that while riding in the car, the pt passed out. Pot is diaphoretic and has a hx of A-fib. After being giving the nitro pt c/o of dizziness.

## 2021-04-09 NOTE — ED Notes (Signed)
Care transferred, report received from Alaina, RN 

## 2021-04-10 ENCOUNTER — Observation Stay
Admit: 2021-04-10 | Discharge: 2021-04-10 | Disposition: A | Discharge status: Home / Self Care | Payer: Medicare HMO | Attending: Internal Medicine | Admitting: Internal Medicine

## 2021-04-10 DIAGNOSIS — E876 Hypokalemia: Secondary | ICD-10-CM | POA: Diagnosis not present

## 2021-04-10 DIAGNOSIS — S48911D Complete traumatic amputation of right shoulder and upper arm, level unspecified, subsequent encounter: Secondary | ICD-10-CM

## 2021-04-10 DIAGNOSIS — R55 Syncope and collapse: Secondary | ICD-10-CM | POA: Diagnosis not present

## 2021-04-10 DIAGNOSIS — I209 Angina pectoris, unspecified: Secondary | ICD-10-CM | POA: Diagnosis not present

## 2021-04-10 DIAGNOSIS — R079 Chest pain, unspecified: Secondary | ICD-10-CM | POA: Diagnosis not present

## 2021-04-10 LAB — BASIC METABOLIC PANEL
Anion gap: 8 (ref 5–15)
BUN: 21 mg/dL (ref 8–23)
CO2: 26 mmol/L (ref 22–32)
Calcium: 9 mg/dL (ref 8.9–10.3)
Chloride: 105 mmol/L (ref 98–111)
Creatinine, Ser: 0.67 mg/dL (ref 0.61–1.24)
GFR, Estimated: >60 mL/min (ref >60)
Glucose, Bld: 106 mg/dL — ABNORMAL HIGH (ref 70–99)
Potassium: 3.9 mmol/L (ref 3.5–5.1)
Sodium: 139 mmol/L (ref 135–145)

## 2021-04-10 LAB — D-DIMER, QUANTITATIVE: D-Dimer, Quant: 0.46 ug/mL-FEU (ref 0.00–0.50)

## 2021-04-10 LAB — CBC
HCT: 37.5 % — ABNORMAL LOW (ref 39.0–52.0)
Hemoglobin: 12.8 g/dL — ABNORMAL LOW (ref 13.0–17.0)
MCH: 33.1 pg (ref 26.0–34.0)
MCHC: 34.1 g/dL (ref 30.0–36.0)
MCV: 96.9 fL (ref 80.0–100.0)
Platelets: 162 10*3/uL (ref 150–400)
RBC: 3.87 MIL/uL — ABNORMAL LOW (ref 4.22–5.81)
RDW: 13.1 % (ref 11.5–15.5)
WBC: 5.8 10*3/uL (ref 4.0–10.5)
nRBC: 0 % (ref 0.0–0.2)

## 2021-04-10 LAB — SARS CORONAVIRUS 2 (TAT 6-24 HRS): SARS Coronavirus 2: NEGATIVE

## 2021-04-10 LAB — TROPONIN I (HIGH SENSITIVITY)
Troponin I (High Sensitivity): 12 ng/L (ref <18)
Troponin I (High Sensitivity): 14 ng/L (ref <18)

## 2021-04-10 LAB — PHOSPHORUS: Phosphorus: 3.2 mg/dL (ref 2.5–4.6)

## 2021-04-10 LAB — APTT: aPTT: 32 seconds (ref 24–36)

## 2021-04-10 LAB — PROTIME-INR
INR: 1.2 (ref 0.8–1.2)
Prothrombin Time: 15.6 seconds — ABNORMAL HIGH (ref 11.4–15.2)

## 2021-04-10 LAB — MAGNESIUM: Magnesium: 2.1 mg/dL (ref 1.7–2.4)

## 2021-04-10 MED ORDER — HEPARIN (PORCINE) 25000 UT/250ML-% IV SOLN
1200.0000 [IU]/h | INTRAVENOUS | Status: DC
  Administered 2021-04-10: 1200 [IU]/h via INTRAVENOUS
  Administered 2021-04-11: 1400 [IU]/h via INTRAVENOUS
  Filled 2021-04-10 (×3): qty 250

## 2021-04-10 MED ORDER — SODIUM CHLORIDE 0.9% FLUSH
3.0000 mL | Freq: Two times a day (BID) | INTRAVENOUS | Status: DC
  Administered 2021-04-11 – 2021-04-12 (×2): 3 mL via INTRAVENOUS

## 2021-04-10 MED ORDER — ISOSORBIDE MONONITRATE ER 30 MG PO TB24
15.0000 mg | ORAL_TABLET | Freq: Every day | ORAL | Status: DC
  Filled 2021-04-10 (×4): qty 1

## 2021-04-10 MED ORDER — METOPROLOL TARTRATE 25 MG PO TABS
12.5000 mg | ORAL_TABLET | Freq: Two times a day (BID) | ORAL | Status: DC
  Administered 2021-04-10 – 2021-04-12 (×5): 12.5 mg via ORAL
  Filled 2021-04-10 (×5): qty 1

## 2021-04-10 MED ORDER — HEPARIN BOLUS VIA INFUSION
4000.0000 [IU] | INTRAVENOUS | Status: AC
  Administered 2021-04-10: 4000 [IU] via INTRAVENOUS
  Filled 2021-04-10: qty 4000

## 2021-04-10 NOTE — ED Notes (Signed)
Called pharm as Imdur still missing. State they will send a dose soon.
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
## 2021-04-10 NOTE — ED Notes (Signed)
Pt repositioned in bed.
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
## 2021-04-10 NOTE — ED Notes (Signed)
Pt's wife at bedside.
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
## 2021-04-10 NOTE — ED Notes (Signed)
Pt resting calmly in bed; alert; watching tv; denies any needs currently; stretcher locked low; rails up; call bell within reach.

## 2021-04-10 NOTE — Progress Notes (Signed)
Brief consult note  Impression Unstable angina Syncope Atrial fibrillation Hypertension GERD Quit smoking . Plan Agree with admit to telemetry Recommend anticoagulation for possible ACS with heparin Follow-up troponins and EKGs Hypokalemia continue to correct electrolytes Echocardiogram for assessment left ventricular function wall motion Consider low-dose beta-blockade therapy Consider nitrate therapy possibly with imdur or paced Would recommend cardiac cath prior to discharge

## 2021-04-10 NOTE — Progress Notes (Signed)
OT Cancellation Note  Patient Details Name: AGRON SWINEY MRN: [id number] DOB: 07-02-1941   Cancelled Treatment:    Reason Eval/Treat Not Completed: Patient not medically ready. OT order received and chart reviewed. Per chart review, pt with continued cardiology work-up following admission for atypical chest pain. Cardiology requesting OT to hold this date. OT to re-attempt at later date as pt is medically appropriate.   Fredirick Maudlin, OTR/L [place]

## 2021-04-10 NOTE — ED Notes (Signed)
Looked all over for pt's imdur dose; unable to find; messaged pharm again.

## 2021-04-10 NOTE — ED Notes (Signed)
Pt updated on plan

## 2021-04-10 NOTE — ED Notes (Signed)
To bedside to go ahead and give metoprolol but pt currently laying on side as echo being completed. Once echo done, this RN will give med.

## 2021-04-10 NOTE — ED Notes (Signed)
Pt given meal tray.

## 2021-04-10 NOTE — ED Notes (Signed)
Pt given dinner tray by dietary staff.

## 2021-04-11 ENCOUNTER — Other Ambulatory Visit: Payer: Self-pay

## 2021-04-11 ENCOUNTER — Encounter
Admission: EM | Disposition: A | Discharge status: Home / Self Care | Payer: Self-pay | Source: Home / Self Care | Attending: Internal Medicine

## 2021-04-11 DIAGNOSIS — I4891 Unspecified atrial fibrillation: Secondary | ICD-10-CM | POA: Diagnosis not present

## 2021-04-11 DIAGNOSIS — R079 Chest pain, unspecified: Secondary | ICD-10-CM | POA: Diagnosis not present

## 2021-04-11 DIAGNOSIS — E876 Hypokalemia: Secondary | ICD-10-CM | POA: Diagnosis present

## 2021-04-11 DIAGNOSIS — R0789 Other chest pain: Secondary | ICD-10-CM | POA: Diagnosis present

## 2021-04-11 DIAGNOSIS — F419 Anxiety disorder, unspecified: Secondary | ICD-10-CM | POA: Diagnosis present

## 2021-04-11 DIAGNOSIS — I25110 Atherosclerotic heart disease of native coronary artery with unstable angina pectoris: Secondary | ICD-10-CM | POA: Diagnosis present

## 2021-04-11 DIAGNOSIS — I209 Angina pectoris, unspecified: Secondary | ICD-10-CM | POA: Diagnosis not present

## 2021-04-11 DIAGNOSIS — R000 Tachycardia, unspecified: Secondary | ICD-10-CM | POA: Diagnosis present

## 2021-04-11 DIAGNOSIS — I2510 Atherosclerotic heart disease of native coronary artery without angina pectoris: Secondary | ICD-10-CM | POA: Diagnosis not present

## 2021-04-11 DIAGNOSIS — M519 Unspecified thoracic, thoracolumbar and lumbosacral intervertebral disc disorder: Secondary | ICD-10-CM | POA: Diagnosis present

## 2021-04-11 DIAGNOSIS — I119 Hypertensive heart disease without heart failure: Secondary | ICD-10-CM | POA: Diagnosis present

## 2021-04-11 DIAGNOSIS — R55 Syncope and collapse: Secondary | ICD-10-CM | POA: Diagnosis present

## 2021-04-11 DIAGNOSIS — Z96642 Presence of left artificial hip joint: Secondary | ICD-10-CM | POA: Diagnosis present

## 2021-04-11 DIAGNOSIS — I4892 Unspecified atrial flutter: Secondary | ICD-10-CM | POA: Diagnosis present

## 2021-04-11 DIAGNOSIS — Z7982 Long term (current) use of aspirin: Secondary | ICD-10-CM | POA: Diagnosis not present

## 2021-04-11 DIAGNOSIS — Z79899 Other long term (current) drug therapy: Secondary | ICD-10-CM | POA: Diagnosis not present

## 2021-04-11 DIAGNOSIS — K2100 Gastro-esophageal reflux disease with esophagitis, without bleeding: Secondary | ICD-10-CM | POA: Diagnosis present

## 2021-04-11 DIAGNOSIS — I482 Chronic atrial fibrillation, unspecified: Secondary | ICD-10-CM | POA: Diagnosis present

## 2021-04-11 DIAGNOSIS — E785 Hyperlipidemia, unspecified: Secondary | ICD-10-CM | POA: Diagnosis present

## 2021-04-11 DIAGNOSIS — Z89201 Acquired absence of right upper limb, unspecified level: Secondary | ICD-10-CM | POA: Diagnosis not present

## 2021-04-11 DIAGNOSIS — Z20822 Contact with and (suspected) exposure to covid-19: Secondary | ICD-10-CM | POA: Diagnosis present

## 2021-04-11 DIAGNOSIS — I872 Venous insufficiency (chronic) (peripheral): Secondary | ICD-10-CM | POA: Diagnosis present

## 2021-04-11 DIAGNOSIS — Z87891 Personal history of nicotine dependence: Secondary | ICD-10-CM | POA: Diagnosis not present

## 2021-04-11 HISTORY — PX: LEFT HEART CATH AND CORONARY ANGIOGRAPHY: CATH118249

## 2021-04-11 LAB — CBC
HCT: 37.4 % — ABNORMAL LOW (ref 39.0–52.0)
Hemoglobin: 13.1 g/dL (ref 13.0–17.0)
MCH: 33.9 pg (ref 26.0–34.0)
MCHC: 35 g/dL (ref 30.0–36.0)
MCV: 96.6 fL (ref 80.0–100.0)
Platelets: 163 10*3/uL (ref 150–400)
RBC: 3.87 MIL/uL — ABNORMAL LOW (ref 4.22–5.81)
RDW: 13.1 % (ref 11.5–15.5)
WBC: 7.3 10*3/uL (ref 4.0–10.5)
nRBC: 0 % (ref 0.0–0.2)

## 2021-04-11 LAB — HEPARIN LEVEL (UNFRACTIONATED)
Heparin Unfractionated: 0.23 IU/mL — ABNORMAL LOW (ref 0.30–0.70)
Heparin Unfractionated: 0.72 IU/mL — ABNORMAL HIGH (ref 0.30–0.70)
Heparin Unfractionated: <0.1 IU/mL — ABNORMAL LOW (ref 0.30–0.70)

## 2021-04-11 LAB — ECHOCARDIOGRAM COMPLETE
Height: 72 in
S' Lateral: 3.7 cm
Weight: 3520 oz

## 2021-04-11 LAB — POCT ACTIVATED CLOTTING TIME: Activated Clotting Time: 468 seconds

## 2021-04-11 SURGERY — LEFT HEART CATH AND CORONARY ANGIOGRAPHY
Anesthesia: Moderate Sedation

## 2021-04-11 MED ORDER — SODIUM CHLORIDE 0.9 % IV SOLN: 250.0000 mL | INTRAVENOUS | Status: DC | PRN

## 2021-04-11 MED ORDER — SODIUM CHLORIDE 0.9 % WEIGHT BASED INFUSION
1.0000 mL/kg/h | INTRAVENOUS | Status: DC
  Administered 2021-04-11: 1 mL/kg/h via INTRAVENOUS

## 2021-04-11 MED ORDER — BIVALIRUDIN TRIFLUOROACETATE 250 MG IV SOLR
INTRAVENOUS | Status: AC
  Filled 2021-04-11: qty 250

## 2021-04-11 MED ORDER — NITROGLYCERIN IN D5W 200-5 MCG/ML-% IV SOLN
INTRAVENOUS | Status: AC | PRN
  Administered 2021-04-11: 10 ug/min via INTRAVENOUS

## 2021-04-11 MED ORDER — TICAGRELOR 90 MG PO TABS
ORAL_TABLET | ORAL | Status: DC | PRN
  Administered 2021-04-11: 180 mg via ORAL

## 2021-04-11 MED ORDER — LIDOCAINE HCL 1 % IJ SOLN
INTRAMUSCULAR | Status: AC
  Filled 2021-04-11: qty 20

## 2021-04-11 MED ORDER — SODIUM CHLORIDE 0.9% FLUSH: 3.0000 mL | INTRAVENOUS | Status: DC | PRN

## 2021-04-11 MED ORDER — ISOSORBIDE MONONITRATE ER 30 MG PO TB24
15.0000 mg | ORAL_TABLET | Freq: Every day | ORAL | Status: DC
  Administered 2021-04-11: 15 mg via ORAL
  Filled 2021-04-11 (×2): qty 1

## 2021-04-11 MED ORDER — HEPARIN (PORCINE) IN NACL 1000-0.9 UT/500ML-% IV SOLN
INTRAVENOUS | Status: AC
  Filled 2021-04-11: qty 1000

## 2021-04-11 MED ORDER — TICAGRELOR 90 MG PO TABS
90.0000 mg | ORAL_TABLET | Freq: Two times a day (BID) | ORAL | Status: DC
  Administered 2021-04-11 – 2021-04-12 (×2): 90 mg via ORAL
  Filled 2021-04-11 (×2): qty 1

## 2021-04-11 MED ORDER — HYDROMORPHONE HCL 1 MG/ML IJ SOLN
2.0000 mg | INTRAMUSCULAR | Status: DC | PRN
Start: 2021-04-11 — End: 2021-04-11
  Administered 2021-04-11: 2 mg via INTRAVENOUS

## 2021-04-11 MED ORDER — ASPIRIN 81 MG PO CHEW
81.0000 mg | CHEWABLE_TABLET | ORAL | Status: DC
Start: 2021-04-12 — End: 2021-04-11

## 2021-04-11 MED ORDER — MORPHINE SULFATE (PF) 2 MG/ML IV SOLN
2.0000 mg | INTRAVENOUS | Status: DC | PRN
Start: 2021-04-11 — End: 2021-04-12
  Administered 2021-04-12: 2 mg via INTRAVENOUS
  Filled 2021-04-11: qty 1

## 2021-04-11 MED ORDER — LABETALOL HCL 5 MG/ML IV SOLN: 10.0000 mg | INTRAVENOUS | Status: AC | PRN

## 2021-04-11 MED ORDER — LOSARTAN POTASSIUM 25 MG PO TABS
25.0000 mg | ORAL_TABLET | Freq: Every day | ORAL | Status: DC
  Administered 2021-04-11 – 2021-04-12 (×2): 25 mg via ORAL
  Filled 2021-04-11 (×3): qty 1

## 2021-04-11 MED ORDER — SODIUM CHLORIDE 0.9% FLUSH
3.0000 mL | Freq: Two times a day (BID) | INTRAVENOUS | Status: DC
  Administered 2021-04-12 (×2): 3 mL via INTRAVENOUS

## 2021-04-11 MED ORDER — TICAGRELOR 90 MG PO TABS
ORAL_TABLET | ORAL | Status: AC
  Filled 2021-04-11: qty 2

## 2021-04-11 MED ORDER — ISOSORBIDE MONONITRATE ER 30 MG PO TB24
30.0000 mg | ORAL_TABLET | Freq: Every day | ORAL | Status: DC
  Administered 2021-04-11 – 2021-04-12 (×2): 30 mg via ORAL
  Filled 2021-04-11 (×2): qty 1

## 2021-04-11 MED ORDER — FENTANYL CITRATE PF 50 MCG/ML IJ SOSY
PREFILLED_SYRINGE | INTRAMUSCULAR | Status: AC
  Filled 2021-04-11: qty 1

## 2021-04-11 MED ORDER — OXYCODONE HCL 5 MG PO TABS
5.0000 mg | ORAL_TABLET | ORAL | Status: DC | PRN
Start: 2021-04-11 — End: 2021-04-12

## 2021-04-11 MED ORDER — HYDRALAZINE HCL 20 MG/ML IJ SOLN: 10.0000 mg | INTRAMUSCULAR | Status: AC | PRN

## 2021-04-11 MED ORDER — METOPROLOL SUCCINATE ER 25 MG PO TB24
12.5000 mg | ORAL_TABLET | Freq: Every day | ORAL | Status: DC
  Administered 2021-04-12: 12.5 mg via ORAL
  Filled 2021-04-11: qty 1
  Filled 2021-04-11: qty 0.5

## 2021-04-11 MED ORDER — ACETAMINOPHEN 325 MG PO TABS
650.0000 mg | ORAL_TABLET | ORAL | Status: DC | PRN
  Administered 2021-04-12: 650 mg via ORAL
  Filled 2021-04-11: qty 2

## 2021-04-11 MED ORDER — BIVALIRUDIN BOLUS VIA INFUSION - CUPID
INTRAVENOUS | Status: DC | PRN
  Administered 2021-04-11: 72.9 mg via INTRAVENOUS

## 2021-04-11 MED ORDER — METHOCARBAMOL 500 MG PO TABS
500.0000 mg | ORAL_TABLET | Freq: Every evening | ORAL | Status: DC
  Filled 2021-04-11 (×3): qty 1

## 2021-04-11 MED ORDER — MIDAZOLAM HCL 2 MG/2ML IJ SOLN
INTRAMUSCULAR | Status: AC
  Filled 2021-04-11: qty 2

## 2021-04-11 MED ORDER — SODIUM CHLORIDE 0.9 % IV SOLN
INTRAVENOUS | Status: AC | PRN
  Administered 2021-04-11: 1.75 mg/kg/h via INTRAVENOUS

## 2021-04-11 MED ORDER — HEPARIN SODIUM (PORCINE) 1000 UNIT/ML IJ SOLN
INTRAMUSCULAR | Status: AC
  Filled 2021-04-11: qty 1

## 2021-04-11 MED ORDER — HYDROMORPHONE HCL 1 MG/ML IJ SOLN
INTRAMUSCULAR | Status: AC
  Filled 2021-04-11: qty 2

## 2021-04-11 MED ORDER — ROSUVASTATIN CALCIUM 10 MG PO TABS
40.0000 mg | ORAL_TABLET | Freq: Every day | ORAL | Status: DC
  Administered 2021-04-11 – 2021-04-12 (×2): 40 mg via ORAL
  Filled 2021-04-11: qty 2
  Filled 2021-04-11 (×2): qty 4

## 2021-04-11 MED ORDER — FENTANYL CITRATE (PF) 100 MCG/2ML IJ SOLN
INTRAMUSCULAR | Status: AC
  Filled 2021-04-11: qty 2

## 2021-04-11 MED ORDER — HEPARIN (PORCINE) IN NACL 2000-0.9 UNIT/L-% IV SOLN
INTRAVENOUS | Status: DC | PRN
  Administered 2021-04-11: 1000 mL

## 2021-04-11 MED ORDER — FENTANYL CITRATE (PF) 100 MCG/2ML IJ SOLN
INTRAMUSCULAR | Status: DC | PRN
  Administered 2021-04-11: 50 ug via INTRAVENOUS
  Administered 2021-04-11 (×2): 25 ug via INTRAVENOUS

## 2021-04-11 MED ORDER — ONDANSETRON HCL 4 MG/2ML IJ SOLN
4.0000 mg | Freq: Four times a day (QID) | INTRAMUSCULAR | Status: DC | PRN
  Administered 2021-04-11 – 2021-04-12 (×2): 4 mg via INTRAVENOUS
  Filled 2021-04-11 (×2): qty 2

## 2021-04-11 MED ORDER — IOHEXOL 350 MG/ML SOLN
INTRAVENOUS | Status: DC | PRN
  Administered 2021-04-11: 220 mL

## 2021-04-11 MED ORDER — NITROGLYCERIN 1 MG/10 ML FOR IR/CATH LAB
INTRA_ARTERIAL | Status: DC | PRN
  Administered 2021-04-11: 200 ug via INTRACORONARY

## 2021-04-11 MED ORDER — SODIUM CHLORIDE 0.9 % WEIGHT BASED INFUSION
1.0000 mL/kg/h | INTRAVENOUS | Status: AC
  Administered 2021-04-11: 1 mL/kg/h via INTRAVENOUS

## 2021-04-11 MED ORDER — HYDRALAZINE HCL 20 MG/ML IJ SOLN
10.0000 mg | Freq: Four times a day (QID) | INTRAMUSCULAR | Status: DC | PRN
  Administered 2021-04-11: 10 mg via INTRAVENOUS
  Filled 2021-04-11: qty 1

## 2021-04-11 MED ORDER — SODIUM CHLORIDE 0.9 % WEIGHT BASED INFUSION
3.0000 mL/kg/h | INTRAVENOUS | Status: DC
  Administered 2021-04-11: 3 mL/kg/h via INTRAVENOUS

## 2021-04-11 MED ORDER — LIDOCAINE HCL (PF) 1 % IJ SOLN
INTRAMUSCULAR | Status: DC | PRN
  Administered 2021-04-11: 20 mL

## 2021-04-11 MED ORDER — HEPARIN BOLUS VIA INFUSION
1500.0000 [IU] | Freq: Once | INTRAVENOUS | Status: AC
  Administered 2021-04-11: 1500 [IU] via INTRAVENOUS
  Filled 2021-04-11: qty 1500

## 2021-04-11 MED ORDER — SODIUM CHLORIDE 0.9 % IV SOLN
0.2500 mg/kg/h | INTRAVENOUS | Status: AC
  Filled 2021-04-11: qty 250

## 2021-04-11 MED ORDER — ASPIRIN 81 MG PO CHEW
81.0000 mg | CHEWABLE_TABLET | Freq: Every day | ORAL | Status: DC
  Administered 2021-04-12: 81 mg via ORAL
  Filled 2021-04-11: qty 1

## 2021-04-11 MED ORDER — MIDAZOLAM HCL 2 MG/2ML IJ SOLN
INTRAMUSCULAR | Status: DC | PRN
  Administered 2021-04-11 (×2): 1 mg via INTRAVENOUS

## 2021-04-11 SURGICAL SUPPLY — 22 items
BALLN EUPHORA RX 2.5X15 (BALLOONS) ×2
BALLOON EUPHORA RX 2.5X15 (BALLOONS) IMPLANT
CATH INFINITI 5FR ANG PIGTAIL (CATHETERS) ×1 IMPLANT
CATH INFINITI 5FR JL4 (CATHETERS) ×1 IMPLANT
CATH INFINITI 5FR JL5 (CATHETERS) ×1 IMPLANT
CATH INFINITI JR4 5F (CATHETERS) ×1 IMPLANT
CATH VISTA GUIDE 6FR XB3.5 (CATHETERS) ×1 IMPLANT
DEVICE CLOSURE MYNXGRIP 6/7F (Vascular Products) ×1 IMPLANT
KIT ENCORE 26 ADVANTAGE (KITS) ×1 IMPLANT
KIT SYRINGE INJ CVI SPIKEX1 (MISCELLANEOUS) ×1 IMPLANT
NDL PERC 18GX7CM (NEEDLE) IMPLANT
NEEDLE PERC 18GX7CM (NEEDLE) ×2 IMPLANT
PACK CARDIAC CATH (CUSTOM PROCEDURE TRAY) ×2 IMPLANT
PROTECTION STATION PRESSURIZED (MISCELLANEOUS) ×2
SET ATX SIMPLICITY (MISCELLANEOUS) ×1 IMPLANT
SHEATH AVANTI 5FR X 11CM (SHEATH) ×1 IMPLANT
SHEATH AVANTI 6FR X 11CM (SHEATH) ×1 IMPLANT
STATION PROTECTION PRESSURIZED (MISCELLANEOUS) IMPLANT
STENT ONYX FRONTIER 2.75X18 (Permanent Stent) ×1 IMPLANT
TUBING CIL FLEX 10 FLL-RA (TUBING) ×1 IMPLANT
WIRE G HI TQ BMW 190 (WIRE) ×1 IMPLANT
WIRE GUIDERIGHT .035X150 (WIRE) ×1 IMPLANT

## 2021-04-11 NOTE — Progress Notes (Signed)
[facility] Cardiology    SUBJECTIVE: Patient with chest pain chest discomfort atrial fibrillation presented with chest pain status post PCI and stent chest pain is improved now and denies any shortness of breath feels reasonably well   Vitals:   04/11/21 1730 04/11/21 1800 04/11/21 1830 04/11/21 2100  BP: 112/84 112/74 122/82 135/74  Pulse: 67 69 72 67  Resp: 18 18 16 19   Temp:    97.7 F (36.5 C)  TempSrc:    Oral  SpO2: 97% 97% 98% 98%  Weight:      Height:         Intake/Output Summary (Last 24 hours) at 04/11/2021 2335 Last data filed at 04/11/2021 1516 Gross per 24 hour  Intake 286.75 ml  Output 250 ml  Net 36.75 ml      PHYSICAL EXAM  General: Well developed, well nourished, in no acute distress HEENT:  Normocephalic and atramatic Neck:  No JVD.  Lungs: Clear bilaterally to auscultation and percussion. Heart: HRRR . Normal S1 and S2 without gallops or murmurs.  Abdomen: Bowel sounds are positive, abdomen soft and non-tender  Msk:  Back normal, normal gait. Normal strength and tone for age. Extremities: No clubbing, cyanosis or edema.   Neuro: Alert and oriented X 3. Psych:  Good affect, responds appropriately   LABS: Basic Metabolic Panel: Recent Labs    04/09/21 1456 04/10/21 0635  NA 139 139  K 2.9* 3.9  CL 103 105  CO2 22 26  GLUCOSE 124* 106*  BUN 23 21  CREATININE 0.90 0.67  CALCIUM 8.6* 9.0  MG  --  2.1  PHOS  --  3.2   Liver Function Tests: Recent Labs    04/09/21 1456  AST 25  ALT 21  ALKPHOS 52  BILITOT 1.1  PROT 6.9  ALBUMIN 4.0   No results for input(s): LIPASE, AMYLASE in the last 72 hours. CBC: Recent Labs    04/09/21 1456 04/10/21 0635 04/11/21 1140  WBC 10.0 5.8 7.3  NEUTROABS 5.1  --   --   HGB 13.4 12.8* 13.1  HCT 36.9* 37.5* 37.4*  MCV 91.1 96.9 96.6  PLT 219 162 163   Cardiac Enzymes: No results for input(s): CKTOTAL, CKMB, CKMBINDEX, TROPONINI in the last 72 hours. BNP: Invalid input(s): POCBNP D-Dimer: Recent  Labs    04/10/21 1441  DDIMER 0.46   Hemoglobin A1C: No results for input(s): HGBA1C in the last 72 hours. Fasting Lipid Panel: No results for input(s): CHOL, HDL, LDLCALC, TRIG, CHOLHDL, LDLDIRECT in the last 72 hours. Thyroid Function Tests: No results for input(s): TSH, T4TOTAL, T3FREE, THYROIDAB in the last 72 hours.  Invalid input(s): FREET3 Anemia Panel: No results for input(s): VITAMINB12, FOLATE, FERRITIN, TIBC, IRON, RETICCTPCT in the last 72 hours.  ECHOCARDIOGRAM COMPLETE  Result Date: 04/11/2021    ECHOCARDIOGRAM REPORT   Patient Name:   [name] Date of Exam: 04/10/2021 Medical Rec #:  [id number]       Height:       72.0 in Accession #:    [id number]      Weight:       220.0 lb Date of Birth:  1941-06-21        BSA:          2.219 m Patient Age:    80 years        BP:           176/109 mmHg Patient Gender: M  HR:           92 bpm. Exam Location:  [facility] Procedure: 2D Echo, Cardiac Doppler and Color Doppler Indications:     R55 Syncope                  R07.9 Chest pain  History:         Patient has no prior history of Echocardiogram examinations.                  Arrythmias:Atrial Fibrillation; Risk Factors:Hypertension.  Sonographer:     Cresenciano Lick RDCS Referring Phys:  [id number] [place] Diagnosing Phys: Yolonda Kida MD IMPRESSIONS  1. Left ventricular ejection fraction, by estimation, is 55 to 60%. The left ventricle has normal function. The left ventricle has no regional wall motion abnormalities. The left ventricular internal cavity size was mildly dilated. There is mild left ventricular hypertrophy. Left ventricular diastolic parameters were normal.  2. Right ventricular systolic function is normal. The right ventricular size is normal.  3. Left atrial size was mildly dilated.  4. Right atrial size was mildly dilated.  5. The mitral valve is grossly normal. Moderate mitral valve regurgitation.  6. Tricuspid valve regurgitation is mild to  moderate.  7. The aortic valve is grossly normal. Aortic valve regurgitation is mild. Mild aortic valve sclerosis is present, with no evidence of aortic valve stenosis. FINDINGS  Left Ventricle: Left ventricular ejection fraction, by estimation, is 55 to 60%. The left ventricle has normal function. The left ventricle has no regional wall motion abnormalities. The left ventricular internal cavity size was mildly dilated. There is  mild left ventricular hypertrophy. Left ventricular diastolic parameters were normal. Right Ventricle: The right ventricular size is normal. No increase in right ventricular wall thickness. Right ventricular systolic function is normal. Left Atrium: Left atrial size was mildly dilated. Right Atrium: Right atrial size was mildly dilated. Pericardium: There is no evidence of pericardial effusion. Mitral Valve: The mitral valve is grossly normal. Moderate mitral valve regurgitation. Tricuspid Valve: The tricuspid valve is normal in structure. Tricuspid valve regurgitation is mild to moderate. Aortic Valve: The aortic valve is grossly normal. Aortic valve regurgitation is mild. Mild aortic valve sclerosis is present, with no evidence of aortic valve stenosis. Pulmonic Valve: The pulmonic valve was grossly normal. Pulmonic valve regurgitation is not visualized. Aorta: The ascending aorta was not well visualized. IAS/Shunts: No atrial level shunt detected by color flow Doppler.  LEFT VENTRICLE PLAX 2D LVIDd:         5.30 cm LVIDs:         3.70 cm LV PW:         0.80 cm LV IVS:        1.20 cm LVOT diam:     2.00 cm LV SV:         42 LV SV Index:   19 LVOT Area:     3.14 cm  RIGHT VENTRICLE RV Basal diam:  4.10 cm RV S prime:     9.25 cm/s TAPSE (M-mode): 1.9 cm LEFT ATRIUM              Index       RIGHT ATRIUM           Index LA diam:        4.90 cm  2.21 cm/m  RA Area:     17.80 cm LA Vol (A2C):   102.0 ml 45.97 ml/m RA Volume:   50.00 ml  22.54 ml/m LA Vol (A4C):   75.2 ml  33.89 ml/m LA  Biplane Vol: 87.4 ml  39.39 ml/m  AORTIC VALVE LVOT Vmax:   67.25 cm/s LVOT Vmean:  49.200 cm/s LVOT VTI:    0.134 m  AORTA Ao Root diam: 3.50 cm Ao Asc diam:  3.40 cm MV E velocity: 81.70 cm/s  TRICUSPID VALVE                            TR Peak grad:   31.8 mmHg                            TR Vmax:        282.00 cm/s                             SHUNTS                            Systemic VTI:  0.13 m                            Systemic Diam: 2.00 cm Becket Wecker D Selah Klang MD Electronically signed by Yolonda Kida MD Signature Date/Time: 04/11/2021/5:40:18 PM    Final      Echo preserved left ventricular function ejection fraction of 65%  TELEMETRY: Atrial fibrillation rate controlled 80 none specific ST-T wave changes 80:  ASSESSMENT AND PLAN:  Active Problems:   Amputation of right arm (HCC)   Atrial fibrillation and flutter (HCC)   Chest pain   Syncope   Hypokalemia   CAD (coronary artery disease)    Plan Status post PCI and stent mid LAD with DES Medical therapy for unstable angina including imdur beta-blocker ARB statin Recommend Crestor therapy at 40 mg a day to help with hyperlipidemia Would recommend long-term anticoagulation for atrial fibrillation probably with Eliquis Recommend triple therapy for at least 30 days then discontinue aspirin and maintain the patient on Eliquis and Plavix Consider switching from Brilinta to Plavix and 3 to 4 weeks Moderate multivessel coronary disease severe single-vessel coronary disease status post PCI and stent Recommend low-dose beta-blocker ARB   Yolonda Kida, MD, 04/11/2021 11:35 PM

## 2021-04-11 NOTE — ED Notes (Addendum)
Pt moved to hospital bed and given additional warm blanket and pillow, Pt verbalizes increased comfort. Plan of care discussed with pt.

## 2021-04-11 NOTE — ED Notes (Signed)
Pt assisted with repositioning in bed for comfort. Pt now resting comfortably in bed, NAD. No needs verbalized at this time. Bed low & locked; call light & personal items within reach.
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
## 2021-04-11 NOTE — ED Notes (Signed)
Messaged provider H. Duncan via secure chat as still no PRN available to address HTN. Awaiting orders.

## 2021-04-11 NOTE — ED Notes (Signed)
Called pharm again about missing Imdur dose. State they will send or edit order as needed soon.

## 2021-04-11 NOTE — ED Notes (Signed)
Pt sleeping, resting comfortably in bed, NAD, chest rise & fall. No needs identified at this time. Bed low & locked; call light & personal items within reach. 

## 2021-04-11 NOTE — Progress Notes (Signed)
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 No signs of bleeding or hematoma. Nitro drip titrated off. Patient is still drowsy from the dilaudid he received in recovery but he keeps waking up stating that he is hot and then says he feels dizzy and feels like he is going to throw up. Vital signs are stable. No complaints of pain. Zofran given for nausea. Wife and son at bedside. Report given to Laural Golden, Therapist, sports.

## 2021-04-11 NOTE — ED Notes (Signed)
Pt c/o back pain, states he needs to go home. Will provide hospital bed/comfort measures and reassess.

## 2021-04-11 NOTE — ED Notes (Signed)
Pt reports moments ago had severe CP when he was adjusting his socks. Reports 2/10 CP now. Skin dry; resp reg/unlabored; laying calmly in bed; pt was asleep upon this RN's entrance to room. Will call pharm again about missing Imdur dose.
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
## 2021-04-11 NOTE — ED Notes (Signed)
Blue top sent to lab for aptt

## 2021-04-11 NOTE — Progress Notes (Signed)
MD made aware of pt's continued and severe upper back pain. Awaiting orders.

## 2021-04-11 NOTE — CV Procedure (Signed)
Brief CV procedure note Inpatient cardiac cath Inpatient PCI and stent to LAD with DES Minx placed to right groin  Patient presented to the cardiac Cath Lab with unstable angina Diagnostic cardiac cath showed reasonable left ventricular function 95% mid LAD Circumflex had moderate disease RCA also had moderate disease  Intervention PCI and stent to mid LAD 2.75 x 18 mm Onyx frontier to 13 atm Lesion was reduced from 95 down to 0% TIMI-3 flow throughout the case Patient is being maintained on angio max for an additional 2 hours at reduced rate Received aspirin and Brilinta We started Crestor metoprolol losartan in addition Anticipate discharge tomorrow

## 2021-04-11 NOTE — Progress Notes (Signed)
OT Cancellation Note  Patient Details Name: [name] MRN: [id number] DOB: 08-29-1940   Cancelled Treatment:    Reason Eval/Treat Not Completed: Patient not medically ready. Per discussion with cardiology, pt with plans to have cardiac cath placed this date. Cardiology requesting OT to hold this date. OT to re-attempt at later date as pt is medically appropriate.   Fredirick Maudlin, OTR/L [place]
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
## 2021-04-12 ENCOUNTER — Encounter: Payer: Self-pay | Admitting: Internal Medicine

## 2021-04-12 DIAGNOSIS — I4891 Unspecified atrial fibrillation: Secondary | ICD-10-CM

## 2021-04-12 DIAGNOSIS — I4892 Unspecified atrial flutter: Secondary | ICD-10-CM | POA: Diagnosis not present

## 2021-04-12 DIAGNOSIS — I2510 Atherosclerotic heart disease of native coronary artery without angina pectoris: Secondary | ICD-10-CM | POA: Diagnosis not present

## 2021-04-12 LAB — CBC
HCT: 33.6 % — ABNORMAL LOW (ref 39.0–52.0)
Hemoglobin: 11.9 g/dL — ABNORMAL LOW (ref 13.0–17.0)
MCH: 33.9 pg (ref 26.0–34.0)
MCHC: 35.4 g/dL (ref 30.0–36.0)
MCV: 95.7 fL (ref 80.0–100.0)
Platelets: 185 10*3/uL (ref 150–400)
RBC: 3.51 MIL/uL — ABNORMAL LOW (ref 4.22–5.81)
RDW: 13.2 % (ref 11.5–15.5)
WBC: 9.5 10*3/uL (ref 4.0–10.5)
nRBC: 0 % (ref 0.0–0.2)

## 2021-04-12 LAB — BASIC METABOLIC PANEL
Anion gap: 6 (ref 5–15)
BUN: 21 mg/dL (ref 8–23)
CO2: 25 mmol/L (ref 22–32)
Calcium: 8.6 mg/dL — ABNORMAL LOW (ref 8.9–10.3)
Chloride: 103 mmol/L (ref 98–111)
Creatinine, Ser: 0.67 mg/dL (ref 0.61–1.24)
GFR, Estimated: >60 mL/min (ref >60)
Glucose, Bld: 115 mg/dL — ABNORMAL HIGH (ref 70–99)
Potassium: 3.7 mmol/L (ref 3.5–5.1)
Sodium: 134 mmol/L — ABNORMAL LOW (ref 135–145)

## 2021-04-12 LAB — CARDIAC CATHETERIZATION: Cath EF Quantitative: 60 %

## 2021-04-12 MED ORDER — ISOSORBIDE MONONITRATE ER 30 MG PO TB24: 30.0000 mg | ORAL_TABLET | Freq: Every day | ORAL | 0 refills | Status: DC

## 2021-04-12 MED ORDER — METOPROLOL SUCCINATE ER 50 MG PO TB24: 50.0000 mg | ORAL_TABLET | Freq: Every day | ORAL | 0 refills | Status: DC

## 2021-04-12 MED ORDER — ROSUVASTATIN CALCIUM 40 MG PO TABS: 40.0000 mg | ORAL_TABLET | Freq: Every day | ORAL | 0 refills | Status: DC

## 2021-04-12 MED ORDER — APIXABAN 5 MG PO TABS
5.0000 mg | ORAL_TABLET | Freq: Two times a day (BID) | ORAL | Status: DC
  Administered 2021-04-12: 5 mg via ORAL
  Filled 2021-04-12: qty 1

## 2021-04-12 MED ORDER — APIXABAN 5 MG PO TABS: 5.0000 mg | ORAL_TABLET | Freq: Two times a day (BID) | ORAL | 0 refills | Status: DC

## 2021-04-12 MED ORDER — LOSARTAN POTASSIUM 25 MG PO TABS: 25.0000 mg | ORAL_TABLET | Freq: Every day | ORAL | 0 refills | Status: DC

## 2021-04-12 MED ORDER — METOPROLOL SUCCINATE ER 50 MG PO TB24
50.0000 mg | ORAL_TABLET | Freq: Every day | ORAL | Status: DC
  Administered 2021-04-12: 50 mg via ORAL
  Filled 2021-04-12: qty 1

## 2021-04-12 MED ORDER — TICAGRELOR 90 MG PO TABS: 90.0000 mg | ORAL_TABLET | Freq: Two times a day (BID) | ORAL | 0 refills | Status: AC
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
## 2021-04-12 NOTE — Progress Notes (Signed)
Patient easily awaken to verbal stimuli. A&Ox4. Patient c/o chest pain 7/10 x1, PRN morphine administered with positive effects noted. Patient VSS. Wife at bedside. Safety maintained, Call light within reached.
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
## 2021-04-16 DIAGNOSIS — F0280 Dementia in other diseases classified elsewhere without behavioral disturbance: Secondary | ICD-10-CM | POA: Diagnosis not present

## 2021-04-16 DIAGNOSIS — I482 Chronic atrial fibrillation, unspecified: Secondary | ICD-10-CM | POA: Diagnosis not present

## 2021-04-16 DIAGNOSIS — I2510 Atherosclerotic heart disease of native coronary artery without angina pectoris: Secondary | ICD-10-CM | POA: Diagnosis not present

## 2021-04-19 DIAGNOSIS — I480 Paroxysmal atrial fibrillation: Secondary | ICD-10-CM | POA: Diagnosis not present

## 2021-04-19 DIAGNOSIS — K219 Gastro-esophageal reflux disease without esophagitis: Secondary | ICD-10-CM | POA: Diagnosis not present

## 2021-04-19 DIAGNOSIS — E782 Mixed hyperlipidemia: Secondary | ICD-10-CM | POA: Diagnosis not present

## 2021-04-19 DIAGNOSIS — I10 Essential (primary) hypertension: Secondary | ICD-10-CM | POA: Diagnosis not present

## 2021-04-19 DIAGNOSIS — R0609 Other forms of dyspnea: Secondary | ICD-10-CM | POA: Diagnosis not present

## 2021-04-19 DIAGNOSIS — I4891 Unspecified atrial fibrillation: Secondary | ICD-10-CM | POA: Diagnosis not present

## 2021-04-19 DIAGNOSIS — I2510 Atherosclerotic heart disease of native coronary artery without angina pectoris: Secondary | ICD-10-CM | POA: Diagnosis not present

## 2021-04-19 DIAGNOSIS — Z95820 Peripheral vascular angioplasty status with implants and grafts: Secondary | ICD-10-CM | POA: Diagnosis not present

## 2021-04-19 DIAGNOSIS — I4892 Unspecified atrial flutter: Secondary | ICD-10-CM | POA: Diagnosis not present

## 2021-04-26 DIAGNOSIS — K5792 Diverticulitis of intestine, part unspecified, without perforation or abscess without bleeding: Secondary | ICD-10-CM | POA: Diagnosis not present

## 2021-05-16 DIAGNOSIS — I4892 Unspecified atrial flutter: Secondary | ICD-10-CM | POA: Diagnosis not present

## 2021-05-16 DIAGNOSIS — I4891 Unspecified atrial fibrillation: Secondary | ICD-10-CM | POA: Diagnosis not present

## 2021-05-22 DIAGNOSIS — D509 Iron deficiency anemia, unspecified: Secondary | ICD-10-CM | POA: Diagnosis not present

## 2021-05-22 DIAGNOSIS — N390 Urinary tract infection, site not specified: Secondary | ICD-10-CM | POA: Diagnosis not present

## 2021-05-22 DIAGNOSIS — E782 Mixed hyperlipidemia: Secondary | ICD-10-CM | POA: Insufficient documentation

## 2021-05-22 DIAGNOSIS — Z125 Encounter for screening for malignant neoplasm of prostate: Secondary | ICD-10-CM | POA: Diagnosis not present

## 2021-05-22 DIAGNOSIS — I482 Chronic atrial fibrillation, unspecified: Secondary | ICD-10-CM | POA: Diagnosis not present

## 2021-05-22 DIAGNOSIS — I2510 Atherosclerotic heart disease of native coronary artery without angina pectoris: Secondary | ICD-10-CM | POA: Diagnosis not present

## 2021-05-30 ENCOUNTER — Ambulatory Visit: Payer: Medicare HMO | Admitting: Dermatology

## 2021-05-30 ENCOUNTER — Encounter: Payer: Self-pay | Admitting: Dermatology

## 2021-05-30 ENCOUNTER — Other Ambulatory Visit: Payer: Self-pay

## 2021-05-30 DIAGNOSIS — L821 Other seborrheic keratosis: Secondary | ICD-10-CM | POA: Diagnosis not present

## 2021-05-30 DIAGNOSIS — L578 Other skin changes due to chronic exposure to nonionizing radiation: Secondary | ICD-10-CM | POA: Diagnosis not present

## 2021-05-30 DIAGNOSIS — L570 Actinic keratosis: Secondary | ICD-10-CM | POA: Diagnosis not present

## 2021-05-30 DIAGNOSIS — L820 Inflamed seborrheic keratosis: Secondary | ICD-10-CM

## 2021-05-30 NOTE — Patient Instructions (Addendum)
Prior to procedure, discussed risks of blister formation, small wound, skin dyspigmentation, or rare scar following cryotherapy. Recommend Vaseline ointment to treated areas while healing.   Cryotherapy Aftercare  Wash gently with soap and water everyday.   Apply Vaseline and Band-Aid daily until healed.   Once areas treated on scalp have healed resume [facility] Fluorouracil/Calcipotriene treatment twice daily for 7-10 days.  Seborrheic Keratosis  What causes seborrheic keratoses? Seborrheic keratoses are harmless, common skin growths that first appear during adult life.  As time goes by, more growths appear.  Some people may develop a large number of them.  Seborrheic keratoses appear on both covered and uncovered body parts.  They are not caused by sunlight.  The tendency to develop seborrheic keratoses can be inherited.  They vary in color from skin-colored to gray, brown, or even black.  They can be either smooth or have a rough, warty surface.   Seborrheic keratoses are superficial and look as if they were stuck on the skin.  Under the microscope this type of keratosis looks like layers upon layers of skin.  That is why at times the top layer may seem to fall off, but the rest of the growth remains and re-grows.    Treatment Seborrheic keratoses do not need to be treated, but can easily be removed in the office.  Seborrheic keratoses often cause symptoms when they rub on clothing or jewelry.  Lesions can be in the way of shaving.  If they become inflamed, they can cause itching, soreness, or burning.  Removal of a seborrheic keratosis can be accomplished by freezing, burning, or surgery. If any spot bleeds, scabs, or grows rapidly, please return to have it checked, as these can be an indication of a skin cancer.  If you have any questions or concerns for your doctor, please call our main line at [phone] and press option 4 to reach your doctor's medical assistant. If no one answers,  please leave a voicemail as directed and we will return your call as soon as possible. Messages left after 4 pm will be answered the following business day.   You may also send [place] a message via [place]. We typically respond to MyChart messages within 1-2 business days.  For prescription refills, please ask your pharmacy to contact our office. Our fax number is [phone].  If you have an urgent issue when the clinic is closed that cannot wait until the next business day, you can page your doctor at the number below.    Please note that while we do our best to be available for urgent issues outside of office hours, we are not available 24/7.   If you have an urgent issue and are unable to reach [place], you may choose to seek medical care at your doctor's office, retail clinic, urgent care center, or emergency room.  If you have a medical emergency, please immediately call 911 or go to the emergency department.  Pager Numbers  - Dr. Nehemiah Massed: [phone]  - Dr. Laurence Ferrari: [phone]  - Dr. Nicole Kindred: [phone]  In the event of inclement weather, please call our main line at [phone] for an update on the status of any delays or closures.  Dermatology Medication Tips: Please keep the boxes that topical medications come in in order to help keep track of the instructions about where and how to use these. Pharmacies typically print the medication instructions only on the boxes and not directly on the medication tubes.   If your  medication is too expensive, please contact our office at [phone] option 4 or send [place] a message through [place].   We are unable to tell what your co-pay for medications will be in advance as this is different depending on your insurance coverage. However, we may be able to find a substitute medication at lower cost or fill out paperwork to get insurance to cover a needed medication.   If a prior authorization is required to get your medication covered by your  insurance company, please allow [place] 1-2 business days to complete this process.  Drug prices often vary depending on where the prescription is filled and some pharmacies may offer cheaper prices.  The website www.goodrx.com contains coupons for medications through different pharmacies. The prices here do not account for what the cost may be with help from insurance (it may be cheaper with your insurance), but the website can give you the price if you did not use any insurance.  - You can print the associated coupon and take it with your prescription to the pharmacy.  - You may also stop by our office during regular business hours and pick up a GoodRx coupon card.  - If you need your prescription sent electronically to a different pharmacy, notify our office through [facility] or by phone at [phone] option 4.

## 2021-05-30 NOTE — Progress Notes (Signed)
Follow-Up Visit   Subjective  [name] is a 80 y.o. male who presents for the following: lesions (Check scalp. Patient c/o lesions he would like checked. Dark in color. Itches at times. Rough texture. ).  He also has h/o Aks of scalp and has used 5FU/VitD cream to scalp in past.    The following portions of the chart were reviewed this encounter and updated as appropriate:      Review of Systems: No other skin or systemic complaints except as noted in HPI or Assessment and Plan.   Objective  Well appearing patient in no apparent distress; mood and affect are within normal limits.  A focused examination was performed including head, including the scalp, face, neck, nose, ears, eyelids, and lips. Relevant physical exam findings are noted in the Assessment and Plan.  Left Parietal Scalp x5 (5) Erythematous keratotic or waxy stuck-on papule or plaque.   crown scalp x11 (11) Erythematous thin papules/macules with gritty scale.   Assessment & Plan  Inflamed seborrheic keratosis Left Parietal Scalp x5  Destruction of lesion - Left Parietal Scalp x5  Destruction method: cryotherapy   Informed consent: discussed and consent obtained   Lesion destroyed using liquid nitrogen: Yes   Region frozen until ice ball extended beyond lesion: Yes   Outcome: patient tolerated procedure well with no complications   Post-procedure details: wound care instructions given    AK (actinic keratosis) (11) crown scalp x11  Actinic keratoses are precancerous spots that appear secondary to cumulative UV radiation exposure/sun exposure over time. They are chronic with expected duration over 1 year. A portion of actinic keratoses will progress to squamous cell carcinoma of the skin. It is not possible to reliably predict which spots will progress to skin cancer and so treatment is recommended to prevent development of skin cancer.  Recommend daily broad spectrum sunscreen SPF 30+ to sun-exposed  areas, reapply every 2 hours as needed.  Recommend staying in the shade or wearing long sleeves, sun glasses (UVA+UVB protection) and wide brim hats (4-inch brim around the entire circumference of the hat). Call for new or changing lesions.  Once areas have healed resume [facility] Fluorouracil/Calcipotriene treatment twice daily for 7-10 days. Call if need Rf's sent to [facility].  Discussed PDT Tx., patient prefers topical field treatment.   5-fluorouracil/calcipotriene cream is is a type of field treatment used to treat precancers, thin skin cancers, and areas of sun damage. Reviewed expected reaction including irritation and mild inflammation potentially progressing to more severe inflammation including redness, scaling, crusting and open sores/erosions.  Reviewed if too much irritation occurs, ensure application of only a thin layer and decrease frequency of use to achieve a tolerable level of inflammation. Recommend applying Vaseline ointment to open sores as needed.  Minimize sun exposure while under treatment. Recommend daily broad spectrum sunscreen SPF 30+ to sun-exposed areas, reapply every 2 hours as needed.       Destruction of lesion - crown scalp x11  Destruction method: cryotherapy   Informed consent: discussed and consent obtained   Lesion destroyed using liquid nitrogen: Yes   Region frozen until ice ball extended beyond lesion: Yes   Outcome: patient tolerated procedure well with no complications   Post-procedure details: wound care instructions given   Additional details:  Prior to procedure, discussed risks of blister formation, small wound, skin dyspigmentation, or rare scar following cryotherapy. Recommend Vaseline ointment to treated areas while healing.   Actinic Damage - Severe, confluent actinic  changes with pre-cancerous actinic keratoses  - Severe, chronic, not at goal, secondary to cumulative UV radiation exposure over time - diffuse scaly erythematous  macules and papules with underlying dyspigmentation - Discussed Prescription "Field Treatment" for Severe, Chronic Confluent Actinic Changes with Pre-Cancerous Actinic Keratoses Field treatment involves treatment of an entire area of skin that has confluent Actinic Changes (Sun/ Ultraviolet light damage) and PreCancerous Actinic Keratoses by method of PhotoDynamic Therapy (PDT) and/or prescription Topical Chemotherapy agents such as 5-fluorouracil, 5-fluorouracil/calcipotriene, and/or imiquimod.  The purpose is to decrease the number of clinically evident and subclinical PreCancerous lesions to prevent progression to development of skin cancer by chemically destroying early precancer changes that may or may not be visible.  It has been shown to reduce the risk of developing skin cancer in the treated area. As a result of treatment, redness, scaling, crusting, and open sores may occur during treatment course. One or more than one of these methods may be used and may have to be used several times to control, suppress and eliminate the PreCancerous changes. Discussed treatment course, expected reaction, and possible side effects. - Recommend daily broad spectrum sunscreen SPF 30+ to sun-exposed areas, reapply every 2 hours as needed.  - Staying in the shade or wearing long sleeves, sun glasses (UVA+UVB protection) and wide brim hats (4-inch brim around the entire circumference of the hat) are also recommended. - Call for new or changing lesions.  - Pt will start 5-fluorouracil/calcipotriene cream bid to scalp x 7-10 days, pt has cream at home  Seborrheic Keratoses - Stuck-on, waxy, tan-brown papules and/or plaques at scalp - Benign-appearing - Discussed benign etiology and prognosis. - Observe - Call for any changes   Return in about 6 months (around 11/27/2021) for AK recheck/Scalp.  I, Emelia Salisbury, CMA, am acting as scribe for Brendolyn Patty, MD.  Documentation: I have reviewed the above  documentation for accuracy and completeness, and I agree with the above.  Brendolyn Patty MD

## 2021-06-22 DIAGNOSIS — E538 Deficiency of other specified B group vitamins: Secondary | ICD-10-CM | POA: Diagnosis not present

## 2021-06-22 DIAGNOSIS — D500 Iron deficiency anemia secondary to blood loss (chronic): Secondary | ICD-10-CM | POA: Diagnosis not present

## 2021-07-26 DIAGNOSIS — I2510 Atherosclerotic heart disease of native coronary artery without angina pectoris: Secondary | ICD-10-CM | POA: Diagnosis not present

## 2021-07-26 DIAGNOSIS — I4891 Unspecified atrial fibrillation: Secondary | ICD-10-CM | POA: Diagnosis not present

## 2021-07-26 DIAGNOSIS — E782 Mixed hyperlipidemia: Secondary | ICD-10-CM | POA: Diagnosis not present

## 2021-07-26 DIAGNOSIS — I480 Paroxysmal atrial fibrillation: Secondary | ICD-10-CM | POA: Diagnosis not present

## 2021-07-26 DIAGNOSIS — I10 Essential (primary) hypertension: Secondary | ICD-10-CM | POA: Diagnosis not present

## 2021-07-26 DIAGNOSIS — S48911S Complete traumatic amputation of right shoulder and upper arm, level unspecified, sequela: Secondary | ICD-10-CM | POA: Diagnosis not present

## 2021-07-26 DIAGNOSIS — K219 Gastro-esophageal reflux disease without esophagitis: Secondary | ICD-10-CM | POA: Diagnosis not present

## 2021-07-26 DIAGNOSIS — Z95820 Peripheral vascular angioplasty status with implants and grafts: Secondary | ICD-10-CM | POA: Diagnosis not present

## 2021-07-26 DIAGNOSIS — R0609 Other forms of dyspnea: Secondary | ICD-10-CM | POA: Diagnosis not present

## 2021-11-27 ENCOUNTER — Encounter: Payer: Self-pay | Admitting: Dermatology

## 2021-11-27 ENCOUNTER — Ambulatory Visit: Payer: Medicare HMO | Admitting: Dermatology

## 2021-11-27 DIAGNOSIS — I872 Venous insufficiency (chronic) (peripheral): Secondary | ICD-10-CM

## 2021-11-27 DIAGNOSIS — L821 Other seborrheic keratosis: Secondary | ICD-10-CM

## 2021-11-27 DIAGNOSIS — Z1283 Encounter for screening for malignant neoplasm of skin: Secondary | ICD-10-CM

## 2021-11-27 DIAGNOSIS — L820 Inflamed seborrheic keratosis: Secondary | ICD-10-CM | POA: Diagnosis not present

## 2021-11-27 DIAGNOSIS — L570 Actinic keratosis: Secondary | ICD-10-CM

## 2021-11-27 DIAGNOSIS — L814 Other melanin hyperpigmentation: Secondary | ICD-10-CM

## 2021-11-27 DIAGNOSIS — L578 Other skin changes due to chronic exposure to nonionizing radiation: Secondary | ICD-10-CM | POA: Diagnosis not present

## 2021-11-27 DIAGNOSIS — D1800 Hemangioma unspecified site: Secondary | ICD-10-CM

## 2021-11-27 NOTE — Progress Notes (Signed)
? ?Follow-Up Visit ?  ?Subjective  ?[name] is a 81 y.o. male who presents for the following: Follow-up. ? ?Patient here for 6 month follow-up AKs of the scalp. He has noticed a few scaly/irritated spots on his scalp, sideburn areas. He also has a rash on his right lower leg, itch improving with TMC 0.1% Cream given by PCP 5 days ago.  Tends to get swelling in lower legs.  ? ? ?The following portions of the chart were reviewed this encounter and updated as appropriate:  ?  ?  ? ?Review of Systems:  No other skin or systemic complaints except as noted in HPI or Assessment and Plan. ? ?Objective  ?Well appearing patient in no apparent distress; mood and affect are within normal limits. ? ?A focused examination was performed including face, scalp, scalp, right leg. Relevant physical exam findings are noted in the Assessment and Plan. ? ?L parietal scalp x 3, R sideburn x 4 (7) ?Erythematous stuck-on, waxy papule or plaque ? ?Left Antihelix x 1, R antihelix x 1 (2) ?Pink keratotic macules ? ?Right mandible ?7.0 x 4.0 mm speckled waxy thin papule ? ?Lower Legs ?Pitting edema and pink scaly patches with excoriations of the right lower leg; mild erythema of the left lower leg ? ? ? ?Assessment & Plan  ?Actinic Damage ?- chronic, secondary to cumulative UV radiation exposure/sun exposure over time ?- diffuse scaly erythematous macules with underlying dyspigmentation ?- Recommend daily broad spectrum sunscreen SPF 30+ to sun-exposed areas, reapply every 2 hours as needed.  ?- Recommend staying in the shade or wearing long sleeves, sun glasses (UVA+UVB protection) and wide brim hats (4-inch brim around the entire circumference of the hat). ?- Call for new or changing lesions. ? ?Seborrheic Keratoses ?- Stuck-on, waxy, tan-brown papules and/or plaques ?- Benign-appearing ?- Discussed benign etiology and prognosis. ?- Observe ?- Call for any changes ? ?Lentigines ?- Scattered tan macules ?- Due to sun exposure ?-  Benign-appering, observe ?- Recommend daily broad spectrum sunscreen SPF 30+ to sun-exposed areas, reapply every 2 hours as needed. ?- Call for any changes ? ?Hemangiomas ?- Red papules ?- Discussed benign nature ?- Observe ?- Call for any changes ? ?Inflamed seborrheic keratosis (7) ?L parietal scalp x 3, R sideburn x 4 ? ?Residual on left parietal scalp. ? ?Destruction of lesion - L parietal scalp x 3, R sideburn x 4 ? ?Destruction method: cryotherapy   ?Informed consent: discussed and consent obtained   ?Lesion destroyed using liquid nitrogen: Yes   ?Region frozen until ice ball extended beyond lesion: Yes   ?Outcome: patient tolerated procedure well with no complications   ?Post-procedure details: wound care instructions given   ?Additional details:  Prior to procedure, discussed risks of blister formation, small wound, skin dyspigmentation, or rare scar following cryotherapy. Recommend Vaseline ointment to treated areas while healing. ? ? ?AK (actinic keratosis) (2) ?Left Antihelix x 1, R antihelix x 1 ? ?Actinic keratoses are precancerous spots that appear secondary to cumulative UV radiation exposure/sun exposure over time. They are chronic with expected duration over 1 year. A portion of actinic keratoses will progress to squamous cell carcinoma of the skin. It is not possible to reliably predict which spots will progress to skin cancer and so treatment is recommended to prevent development of skin cancer. ? ?Recommend daily broad spectrum sunscreen SPF 30+ to sun-exposed areas, reapply every 2 hours as needed.  ?Recommend staying in the shade or wearing long sleeves, sun glasses (UVA+UVB  protection) and wide brim hats (4-inch brim around the entire circumference of the hat). ?Call for new or changing lesions. ? ?Destruction of lesion - Left Antihelix x 1, R antihelix x 1 ? ?Destruction method: cryotherapy   ?Informed consent: discussed and consent obtained   ?Lesion destroyed using liquid nitrogen: Yes    ?Region frozen until ice ball extended beyond lesion: Yes   ?Outcome: patient tolerated procedure well with no complications   ?Post-procedure details: wound care instructions given   ?Additional details:  Prior to procedure, discussed risks of blister formation, small wound, skin dyspigmentation, or rare scar following cryotherapy. Recommend Vaseline ointment to treated areas while healing. ? ? ?Seborrheic keratosis ?Right mandible ? ?Reassured benign age-related growth.  Recommend observation.  Discussed cryotherapy if spot(s) become irritated or inflamed. ? ?Stasis dermatitis of both legs ?Lower Legs ? ?Chronic and persistent condition with duration or expected duration over one year. Condition is bothersome/symptomatic for patient. Currently flared. Improving on topical steroid cream ? ?Stasis in the legs causes chronic leg swelling, which may result in itchy or painful rashes, skin discoloration, skin texture changes, and sometimes ulceration.  Recommend daily graduated compression hose/stockings- easiest to put on first thing in morning, remove at bedtime.  Elevate legs as much as possible. Avoid salt/sodium rich foods. ? ?Continue TMC 0.1% cream bid until itchy rash cleared, Caution skin atrophy with long-term use.  ?Recommend wearing compression socks daily ? ? ?Skin cancer screening performed today.  ? ?Return in about 1 year (around 11/28/2022) for UBSE, Hx AKs. ? ?I, Jamesetta Orleans, CMA, am acting as scribe for Brendolyn Patty, MD . ? ?Documentation: I have reviewed the above documentation for accuracy and completeness, and I agree with the above. ? ?Brendolyn Patty MD  ? ?

## 2021-11-27 NOTE — Patient Instructions (Addendum)
Stasis in the legs causes chronic leg swelling, which may result in itchy or painful rashes, skin discoloration, skin texture changes, and sometimes ulceration.  Recommend daily graduated compression hose/stockings- easiest to put on first thing in morning, remove at bedtime.  Elevate legs as much as possible. Avoid salt/sodium rich foods. ? ?Continue Triamcinolone 0.1% Cream twice daily to rash on leg as needed until improved or up to 2 weeks. Then use as needed for flares. Avoid face, groin, underarms. ?Topical steroids (such as triamcinolone, fluocinolone, fluocinonide, mometasone, clobetasol, halobetasol, betamethasone, hydrocortisone) can cause thinning and lightening of the skin if they are used for too long in the same area. Your physician has selected the right strength medicine for your problem and area affected on the body. Please use your medication only as directed by your physician to prevent side effects.  ? ?Recommend mild soap and moisturizing cream 1-2 times daily.  Gentle skin care handout provided.  ? ? ?Cryotherapy Aftercare ? ?Wash gently with soap and water everyday.   ?Apply Vaseline and Band-Aid daily until healed.  ? ? ?If You Need Anything After Your Visit ? ?If you have any questions or concerns for your doctor, please call our main line at [phone] and press option 4 to reach your doctor's medical assistant. If no one answers, please leave a voicemail as directed and we will return your call as soon as possible. Messages left after 4 pm will be answered the following business day.  ? ?You may also send [place] a message via MyChart. We typically respond to MyChart messages within 1-2 business days. ? ?For prescription refills, please ask your pharmacy to contact our office. Our fax number is [phone]. ? ?If you have an urgent issue when the clinic is closed that cannot wait until the next business day, you can page your doctor at the number below.   ? ?Please note that while we do our  best to be available for urgent issues outside of office hours, we are not available 24/7.  ? ?If you have an urgent issue and are unable to reach [place], you may choose to seek medical care at your doctor's office, retail clinic, urgent care center, or emergency room. ? ?If you have a medical emergency, please immediately call 911 or go to the emergency department. ? ?Pager Numbers ? ?- Dr. Nehemiah Massed: [phone] ? ?- Dr. Laurence Ferrari: [phone] ? ?- Dr. Nicole Kindred: [phone] ? ?In the event of inclement weather, please call our main line at [phone] for an update on the status of any delays or closures. ? ?Dermatology Medication Tips: ?Please keep the boxes that topical medications come in in order to help keep track of the instructions about where and how to use these. Pharmacies typically print the medication instructions only on the boxes and not directly on the medication tubes.  ? ?If your medication is too expensive, please contact our office at [phone] option 4 or send [place] a message through [place].  ? ?We are unable to tell what your co-pay for medications will be in advance as this is different depending on your insurance coverage. However, we may be able to find a substitute medication at lower cost or fill out paperwork to get insurance to cover a needed medication.  ? ?If a prior authorization is required to get your medication covered by your insurance company, please allow [place] 1-2 business days to complete this process. ? ?Drug prices often vary depending on where the prescription is filled and some  pharmacies may offer cheaper prices. ? ?The website www.goodrx.com contains coupons for medications through different pharmacies. The prices here do not account for what the cost may be with help from insurance (it may be cheaper with your insurance), but the website can give you the price if you did not use any insurance.  ?- You can print the associated coupon and take it with your prescription to the  pharmacy.  ?- You may also stop by our office during regular business hours and pick up a GoodRx coupon card.  ?- If you need your prescription sent electronically to a different pharmacy, notify our office through [facility] or by phone at [phone] option 4. ? ? ? ? ?Si Usted Necesita Algo Despu?s de Su Visita ? ?Tambi?n puede enviarnos un mensaje a trav?s de MyChart. Por lo general respondemos a los mensajes de MyChart en el transcurso de 1 a 2 d?as h?biles. ? ?Para renovar recetas, por favor pida a su farmacia que se ponga en contacto con nuestra oficina. Nuestro n?mero de fax es el [phone]. ? ?Si tiene un asunto urgente cuando la cl?nica est? cerrada y que no puede esperar hasta el siguiente d?a h?bil, puede llamar/localizar a su doctor(a) al n?mero que aparece a continuaci?n.  ? ?Por favor, tenga en cuenta que aunque hacemos todo lo posible para estar disponibles para asuntos urgentes fuera del horario de oficina, no estamos disponibles las 24 horas del d?a, los 7 d?as de la semana.  ? ?Si tiene un problema urgente y no puede comunicarse con nosotros, puede optar por buscar atenci?n m?dica  en el consultorio de su doctor(a), en una cl?nica privada, en un centro de atenci?n urgente o en una sala de emergencias. ? ?Si tiene Engineer, maintenance (IT) m?dica, por favor llame inmediatamente al 911 o vaya a la sala de emergencias. ? ?N?meros de b?per ? ?- Dr. Nehemiah Massed: [phone] ? ?- Dra. Moye: [phone] ? ?- Dra. Nicole Kindred: [phone] ? ?En caso de inclemencias del tiempo, por favor llame a nuestra l?nea principal al [phone] para una actualizaci?n sobre el estado de cualquier retraso o cierre. ? ?Consejos para la medicaci?n en dermatolog?a: ?Por favor, guarde las cajas en las que vienen los medicamentos de uso t?pico para ayudarle a seguir las instrucciones sobre d?nde y c?mo usarlos. Las farmacias generalmente imprimen las instrucciones del medicamento s?lo en las cajas y no directamente en los  tubos del [place].  ? ?Si su medicamento es muy caro, por favor, p?ngase en contacto con Zigmund Daniel llamando al [phone] y presione la opci?n 4 o env?enos un mensaje a trav?s de MyChart.  ? ?No podemos decirle cu?l ser? su copago por los medicamentos por adelantado ya que esto es diferente dependiendo de la cobertura de su seguro. Sin embargo, es posible que podamos encontrar un medicamento sustituto a Electrical engineer un formulario para que el seguro cubra el medicamento que se considera necesario.  ? ?Si se requiere Ardelia Mems autorizaci?n previa para que su compa??a de seguros [place] su medicamento, por favor perm?tanos de 1 a 2 d?as h?biles para completar este proceso. ? ?Los precios de los medicamentos var?an con frecuencia dependiendo del Environmental consultant de d?nde se surte la receta y alguna farmacias pueden ofrecer precios m?s baratos. ? ?El sitio web www.goodrx.com tiene cupones para medicamentos de Airline pilot. Los precios aqu? no tienen en cuenta lo que podr?a costar con la ayuda del seguro (puede ser m?s barato con su seguro), pero el sitio web puede darle el precio si  no utiliz? ning?n seguro.  ?- Puede imprimir el cup?n correspondiente y llevarlo con su receta a la farmacia.  ?- Tambi?n puede pasar por nuestra oficina durante el horario de atenci?n regular y recoger una tarjeta de cupones de GoodRx.  ?- Si necesita que su receta se env?e electr?nicamente a Chiropodist, informe a nuestra oficina a trav?s de MyChart de [place] o por tel?fono llamando al [phone] y presione la opci?n 4. ? ?

## 2022-01-29 ENCOUNTER — Other Ambulatory Visit: Payer: Self-pay

## 2022-01-29 ENCOUNTER — Emergency Department
Admission: EM | Admit: 2022-01-29 | Discharge: 2022-01-29 | Disposition: A | Discharge status: Home / Self Care | Payer: Medicare HMO | Attending: Emergency Medicine | Admitting: Emergency Medicine

## 2022-01-29 ENCOUNTER — Emergency Department: Payer: Medicare HMO

## 2022-01-29 DIAGNOSIS — Z7901 Long term (current) use of anticoagulants: Secondary | ICD-10-CM | POA: Insufficient documentation

## 2022-01-29 DIAGNOSIS — W010XXA Fall on same level from slipping, tripping and stumbling without subsequent striking against object, initial encounter: Secondary | ICD-10-CM | POA: Insufficient documentation

## 2022-01-29 DIAGNOSIS — W19XXXA Unspecified fall, initial encounter: Secondary | ICD-10-CM

## 2022-01-29 DIAGNOSIS — S0181XA Laceration without foreign body of other part of head, initial encounter: Secondary | ICD-10-CM | POA: Diagnosis not present

## 2022-01-29 DIAGNOSIS — S0292XA Unspecified fracture of facial bones, initial encounter for closed fracture: Secondary | ICD-10-CM | POA: Diagnosis not present

## 2022-01-29 DIAGNOSIS — S0993XA Unspecified injury of face, initial encounter: Secondary | ICD-10-CM | POA: Diagnosis present

## 2022-01-29 MED ORDER — AMOXICILLIN-POT CLAVULANATE 875-125 MG PO TABS: 1.0000 | ORAL_TABLET | Freq: Two times a day (BID) | ORAL | 0 refills | Status: AC

## 2022-01-29 MED ORDER — TRANEXAMIC ACID FOR EPISTAXIS
500.0000 mg | Freq: Once | TOPICAL | Status: AC
  Administered 2022-01-29: 500 mg via TOPICAL
  Filled 2022-01-29: qty 10

## 2022-01-29 MED ORDER — AMOXICILLIN-POT CLAVULANATE 875-125 MG PO TABS: 1.0000 | ORAL_TABLET | Freq: Two times a day (BID) | ORAL | 0 refills | Status: DC

## 2022-01-29 MED ORDER — OXYMETAZOLINE HCL 0.05 % NA SOLN
1.0000 | Freq: Once | NASAL | Status: AC
  Administered 2022-01-29: 1 via NASAL
  Filled 2022-01-29: qty 30

## 2022-01-29 NOTE — ED Triage Notes (Signed)
Pt presents to ED via [facility] with c/o of having mechanical fall while he was outside getting his trash can PTA. Pt does take Eliquis daily. Pt denies any dizziness or reasons for fall other than he tripped on the curb. Pt denies LOC. Pt does have laceration to upper L lip in the inside. Pt also has lac to bridge of the nose. Abrasions to bilateral knees and L elbow area. Minor cuts and scratches to forehead noted as well. Pt is A&Ox4 at this time. NAD noted. Pt c/o of knee pain.

## 2022-01-29 NOTE — ED Notes (Signed)
All lacerations cleansed with betadine and NS at this time.

## 2022-01-29 NOTE — ED Notes (Signed)
Nose clamp placed

## 2022-01-29 NOTE — ED Provider Notes (Signed)
[facility] Provider Note    Event Date/Time   First MD Initiated Contact with Patient 01/29/22 [id number]     (approximate)   History   Fall (Eliquis) and Facial Laceration   HPI  [name] is a 81 y.o. male  who presents to the emergency department today because of concern for a fall. The patient staets he was bringing in the trash when he tripped on the curb. The patient did hit his face, left elbow and knees on the ground. Complaining primarily of knee pain. The patient is on eliquis and plavix. Denies any recent illness.   Physical Exam   Triage Vital Signs: ED Triage Vitals  Enc Vitals Group     BP 01/29/22 0846 (!) 150/92     Pulse Rate 01/29/22 0846 79     Resp 01/29/22 0846 18     Temp 01/29/22 0846 97.9 F (36.6 C)     Temp Source 01/29/22 0846 Oral     SpO2 01/29/22 0846 97 %     Weight 01/29/22 0848 220 lb (99.8 kg)     Height 01/29/22 0848 '6\' 1"'$  (1.854 m)     Head Circumference --      Peak Flow --      Pain Score 01/29/22 0847 2     Pain Loc --      Pain Edu? --      Excl. in [place]? --     Most recent vital signs: Vitals:   01/29/22 0846  BP: (!) 150/92  Pulse: 79  Resp: 18  Temp: 97.9 F (36.6 C)  SpO2: 97%    General: Awake, alert, oriented. CV:  Good peripheral perfusion. Regular rate. Resp:  Normal effort.  Abd:  No distention.  Other:  Multiple abrasions, lacerations to face, bilateral knees and left elbow.   ED Results / Procedures / Treatments   Labs (all labs ordered are listed, but only abnormal results are displayed) Labs Reviewed - No data to display   EKG  None   RADIOLOGY I independently interpreted and visualized the CT cervical spine. My interpretation: No fracture Radiology interpretation:  IMPRESSION:  No acute cervical spine fracture.    Multilevel degenerative changes. 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 My interpretation: No bleed Radiology interpretation:  IMPRESSION:  No evidence of acute intracranial injury.    Nondisplaced acute fracture of the inferolateral wall left maxillary  sinus. Probable involvement of the medial wall at the same level.    Patchy nasal cavity opacification with mild irregularity of the  nasal septum. Nondisplaced fracture is not excluded.   I independently interpreted and visualized the ct max/face. My interpretation: fluid in left maxillary sinus Radiology interpretation:  IMPRESSION:  No evidence of acute intracranial injury.    Nondisplaced acute fracture of the inferolateral wall left maxillary  sinus. Probable involvement of the medial wall at the same level.    Patchy nasal cavity opacification with mild irregularity of the  nasal septum. Nondisplaced fracture is not excluded.      PROCEDURES:  Critical Care performed: No  Procedures   MEDICATIONS ORDERED IN ED: Medications - No data to display   IMPRESSION / MDM / [place] / ED COURSE  I reviewed the triage vital signs and the nursing notes.  Differential diagnosis includes, but is not limited to, intracranial bleed, acute fracture.  Patient's presentation is most consistent with acute presentation with potential threat to life or bodily function.  Patient presented to the emergency department today because of concerns for a fall.  It was mechanical in nature.  Patient with abrasions to face bilateral knees and left elbow.  CT imaging of the head showed left maxillary sinus fracture and possible nasal fracture.  No intracranial bleed or abnormality. Patient did have a nosebleed which was controlled with afrin and nasal clamp. No significant pain or swelling to the extremities to suggest fracture and patient was comfortable deferring imaging.   FINAL CLINICAL IMPRESSION(S) / ED DIAGNOSES   Final diagnoses:  Fall, initial encounter   Closed fracture of facial bone, unspecified facial bone, initial encounter [facility])      Note:  This document was prepared using Dragon voice recognition software and may include unintentional dictation errors.    Nance Pear, MD 01/29/22 1110

## 2022-01-29 NOTE — Discharge Instructions (Signed)
Please seek medical attention for any high fevers, chest pain, shortness of breath, change in behavior, persistent vomiting, bloody stool or any other new or concerning symptoms.  

## 2022-01-29 NOTE — ED Notes (Signed)
Pt A&O, pt given discharge instructions, pt ambulating with steady gait. 

## 2022-01-29 NOTE — ED Notes (Signed)
Pt at CT

## 2022-05-28 ENCOUNTER — Ambulatory Visit: Payer: Medicare HMO | Admitting: Dermatology

## 2022-05-28 DIAGNOSIS — L578 Other skin changes due to chronic exposure to nonionizing radiation: Secondary | ICD-10-CM | POA: Diagnosis not present

## 2022-05-28 DIAGNOSIS — C44219 Basal cell carcinoma of skin of left ear and external auricular canal: Secondary | ICD-10-CM

## 2022-05-28 DIAGNOSIS — D485 Neoplasm of uncertain behavior of skin: Secondary | ICD-10-CM

## 2022-05-28 DIAGNOSIS — C4491 Basal cell carcinoma of skin, unspecified: Secondary | ICD-10-CM

## 2022-05-28 HISTORY — DX: Basal cell carcinoma of skin, unspecified: C44.91

## 2022-05-28 NOTE — Patient Instructions (Addendum)
Wound Care Instructions  Cleanse wound gently with soap and water once a day then pat dry with clean gauze. Apply a thin coat of Petrolatum (petroleum jelly, "Vaseline") over the wound (unless you have an allergy to this). We recommend that you use a new, sterile tube of Vaseline. Do not pick or remove scabs. Do not remove the yellow or white "healing tissue" from the base of the wound.  Cover the wound with fresh, clean, nonstick gauze and secure with paper tape. You may use Band-Aids in place of gauze and tape if the wound is small enough, but would recommend trimming much of the tape off as there is often too much. Sometimes Band-Aids can irritate the skin.  You should call the office for your biopsy report after 1 week if you have not already been contacted.  If you experience any problems, such as abnormal amounts of bleeding, swelling, significant bruising, significant pain, or evidence of infection, please call the office immediately.  FOR ADULT SURGERY PATIENTS: If you need something for pain relief you may take 1 extra strength Tylenol (acetaminophen) AND 2 Ibuprofen (200mg each) together every 4 hours as needed for pain. (do not take these if you are allergic to them or if you have a reason you should not take them.) Typically, you may only need pain medication for 1 to 3 days.     Due to recent changes in healthcare laws, you may see results of your pathology and/or laboratory studies on MyChart before the doctors have had a chance to review them. We understand that in some cases there may be results that are confusing or concerning to you. Please understand that not all results are received at the same time and often the doctors may need to interpret multiple results in order to provide you with the best plan of care or course of treatment. Therefore, we ask that you please give us 2 business days to thoroughly review all your results before contacting the office for clarification. Should  we see a critical lab result, you will be contacted sooner.   If You Need Anything After Your Visit  If you have any questions or concerns for your doctor, please call our main line at [phone] and press option 4 to reach your doctor's medical assistant. If no one answers, please leave a voicemail as directed and we will return your call as soon as possible. Messages left after 4 pm will be answered the following business day.   You may also send us a message via MyChart. We typically respond to MyChart messages within 1-2 business days.  For prescription refills, please ask your pharmacy to contact our office. Our fax number is [phone].  If you have an urgent issue when the clinic is closed that cannot wait until the next business day, you can page your doctor at the number below.    Please note that while we do our best to be available for urgent issues outside of office hours, we are not available 24/7.   If you have an urgent issue and are unable to reach us, you may choose to seek medical care at your doctor's office, retail clinic, urgent care center, or emergency room.  If you have a medical emergency, please immediately call 911 or go to the emergency department.  Pager Numbers  - Dr. Kowalski: [phone]  - Dr. Moye: [phone]  - Dr. Stewart: [phone]  In the event of inclement weather, please call our main line at   [phone] for an update on the status of any delays or closures.  Dermatology Medication Tips: Please keep the boxes that topical medications come in in order to help keep track of the instructions about where and how to use these. Pharmacies typically print the medication instructions only on the boxes and not directly on the medication tubes.   If your medication is too expensive, please contact our office at [phone] option 4 or send us a message through MyChart.   We are unable to tell what your co-pay for medications will be in  advance as this is different depending on your insurance coverage. However, we may be able to find a substitute medication at lower cost or fill out paperwork to get insurance to cover a needed medication.   If a prior authorization is required to get your medication covered by your insurance company, please allow us 1-2 business days to complete this process.  Drug prices often vary depending on where the prescription is filled and some pharmacies may offer cheaper prices.  The website www.goodrx.com contains coupons for medications through different pharmacies. The prices here do not account for what the cost may be with help from insurance (it may be cheaper with your insurance), but the website can give you the price if you did not use any insurance.  - You can print the associated coupon and take it with your prescription to the pharmacy.  - You may also stop by our office during regular business hours and pick up a GoodRx coupon card.  - If you need your prescription sent electronically to a different pharmacy, notify our office through [place] MyChart or by phone at [phone] option 4.     Si Usted Necesita Algo Despus de Su Visita  Tambin puede enviarnos un mensaje a travs de MyChart. Por lo general respondemos a los mensajes de MyChart en el transcurso de 1 a 2 das hbiles.  Para renovar recetas, por favor pida a su farmacia que se ponga en contacto con nuestra oficina. Nuestro nmero de fax es el [phone].  Si tiene un asunto urgente cuando la clnica est cerrada y que no puede esperar hasta el siguiente da hbil, puede llamar/localizar a su doctor(a) al nmero que aparece a continuacin.   Por favor, tenga en cuenta que aunque hacemos todo lo posible para estar disponibles para asuntos urgentes fuera del horario de oficina, no estamos disponibles las 24 horas del da, los 7 das de la semana.   Si tiene un problema urgente y no puede comunicarse con nosotros, puede  optar por buscar atencin mdica  en el consultorio de su doctor(a), en una clnica privada, en un centro de atencin urgente o en una sala de emergencias.  Si tiene una emergencia mdica, por favor llame inmediatamente al 911 o vaya a la sala de emergencias.  Nmeros de bper  - Dr. Kowalski: [phone]  - Dra. Moye: [phone]  - Dra. Stewart: [phone]  En caso de inclemencias del tiempo, por favor llame a nuestra lnea principal al [phone] para una actualizacin sobre el estado de cualquier retraso o cierre.  Consejos para la medicacin en dermatologa: Por favor, guarde las cajas en las que vienen los medicamentos de uso tpico para ayudarle a seguir las instrucciones sobre dnde y cmo usarlos. Las farmacias generalmente imprimen las instrucciones del medicamento slo en las cajas y no directamente en los tubos del medicamento.   Si su medicamento es muy caro, por favor, pngase en contacto con   nuestra oficina llamando al [phone] y presione la opcin 4 o envenos un mensaje a travs de MyChart.   No podemos decirle cul ser su copago por los medicamentos por adelantado ya que esto es diferente dependiendo de la cobertura de su seguro. Sin embargo, es posible que podamos encontrar un medicamento sustituto a menor costo o llenar un formulario para que el seguro cubra el medicamento que se considera necesario.   Si se requiere una autorizacin previa para que su compaa de seguros cubra su medicamento, por favor permtanos de 1 a 2 das hbiles para completar este proceso.  Los precios de los medicamentos varan con frecuencia dependiendo del lugar de dnde se surte la receta y alguna farmacias pueden ofrecer precios ms baratos.  El sitio web www.goodrx.com tiene cupones para medicamentos de diferentes farmacias. Los precios aqu no tienen en cuenta lo que podra costar con la ayuda del seguro (puede ser ms barato con su seguro), pero el sitio web puede darle el  precio si no utiliz ningn seguro.  - Puede imprimir el cupn correspondiente y llevarlo con su receta a la farmacia.  - Tambin puede pasar por nuestra oficina durante el horario de atencin regular y recoger una tarjeta de cupones de GoodRx.  - Si necesita que su receta se enve electrnicamente a una farmacia diferente, informe a nuestra oficina a travs de MyChart de [place] o por telfono llamando al [phone] y presione la opcin 4.  

## 2022-05-28 NOTE — Progress Notes (Signed)
   Follow-Up Visit   Subjective  [name] is a 81 y.o. male who presents for the following: rough spot (Left ear, noticed by wife a week ago. Hx of AKs.).   The following portions of the chart were reviewed this encounter and updated as appropriate:       Review of Systems:  No other skin or systemic complaints except as noted in HPI or Assessment and Plan.  Objective  Well appearing patient in no apparent distress; mood and affect are within normal limits.  A focused examination was performed including face. Relevant physical exam findings are noted in the Assessment and Plan.  left lower ear helix 6 mm pink crusted macule       Assessment & Plan  Actinic Damage - chronic, secondary to cumulative UV radiation exposure/sun exposure over time - diffuse scaly erythematous macules with underlying dyspigmentation - Recommend daily broad spectrum sunscreen SPF 30+ to sun-exposed areas, reapply every 2 hours as needed.  - Recommend staying in the shade or wearing long sleeves, sun glasses (UVA+UVB protection) and wide brim hats (4-inch brim around the entire circumference of the hat). - Call for new or changing lesions.  Neoplasm of uncertain behavior of skin left lower ear helix  Skin / nail biopsy Type of biopsy: tangential   Informed consent: discussed and consent obtained   Patient was prepped and draped in usual sterile fashion: Area prepped with alcohol. Anesthesia: the lesion was anesthetized in a standard fashion   Anesthetic:  1% lidocaine w/ epinephrine 1-100,000 buffered w/ 8.4% NaHCO3 Instrument used: flexible razor blade   Hemostasis achieved with: pressure, aluminum chloride and electrodesiccation   Outcome: patient tolerated procedure well   Post-procedure details: wound care instructions given   Post-procedure details comment:  Ointment and small bandage applied  Specimen 1 - Surgical pathology Differential Diagnosis: AK r/o BCC Check  Margins: No  If positive, discussed EDC vs Mohs.    Return as scheduled, sooner pending bx.  IJamesetta Orleans, CMA, am acting as scribe for Brendolyn Patty, MD .  Documentation: I have reviewed the above documentation for accuracy and completeness, and I agree with the above.  Brendolyn Patty MD

## 2022-06-03 ENCOUNTER — Telehealth: Payer: Self-pay

## 2022-06-03 NOTE — Telephone Encounter (Signed)
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 EDC would leave a round depressed whitish scar about the same size as the original lesion.  It is treated here in office in a procedure we call "scrape and burn".  No further pathology would be performed.  Mid to high 80% cure rate.  Mohs would leave a linear scar and pathology would be done at time of procedure to ensure complete removal.  It has a high 90s% cure rate. We would refer patient to a specialist for Mohs surgery.    - please call patient

## 2022-06-03 NOTE — Telephone Encounter (Signed)
Left message with wife for patient to call for biopsy results.

## 2022-06-03 NOTE — Telephone Encounter (Signed)
Discussed biopsy results with pt, scheduled EDC

## 2022-06-10 ENCOUNTER — Encounter: Payer: Self-pay | Admitting: Dermatology

## 2022-06-10 ENCOUNTER — Ambulatory Visit: Payer: Medicare HMO | Admitting: Dermatology

## 2022-06-10 ENCOUNTER — Telehealth: Payer: Self-pay

## 2022-06-10 DIAGNOSIS — C44219 Basal cell carcinoma of skin of left ear and external auricular canal: Secondary | ICD-10-CM

## 2022-06-10 DIAGNOSIS — L578 Other skin changes due to chronic exposure to nonionizing radiation: Secondary | ICD-10-CM | POA: Diagnosis not present

## 2022-06-10 NOTE — Telephone Encounter (Signed)
Patient scheduled. aw

## 2022-06-10 NOTE — Patient Instructions (Addendum)
Wound Care Instructions  Cleanse wound gently with soap and water once a day then pat dry with clean gauze. Apply a thin coat of Petrolatum (petroleum jelly, "Vaseline") over the wound (unless you have an allergy to this). We recommend that you use a new, sterile tube of Vaseline. Do not pick or remove scabs. Do not remove the yellow or white "healing tissue" from the base of the wound.  Cover the wound with fresh, clean, nonstick gauze and secure with paper tape. You may use Band-Aids in place of gauze and tape if the wound is small enough, but would recommend trimming much of the tape off as there is often too much. Sometimes Band-Aids can irritate the skin.  You should call the office for your biopsy report after 1 week if you have not already been contacted.  If you experience any problems, such as abnormal amounts of bleeding, swelling, significant bruising, significant pain, or evidence of infection, please call the office immediately.  FOR ADULT SURGERY PATIENTS: If you need something for pain relief you may take 1 extra strength Tylenol (acetaminophen) AND 2 Ibuprofen (200mg each) together every 4 hours as needed for pain. (do not take these if you are allergic to them or if you have a reason you should not take them.) Typically, you may only need pain medication for 1 to 3 days.     Due to recent changes in healthcare laws, you may see results of your pathology and/or laboratory studies on MyChart before the doctors have had a chance to review them. We understand that in some cases there may be results that are confusing or concerning to you. Please understand that not all results are received at the same time and often the doctors may need to interpret multiple results in order to provide you with the best plan of care or course of treatment. Therefore, we ask that you please give us 2 business days to thoroughly review all your results before contacting the office for clarification. Should  we see a critical lab result, you will be contacted sooner.   If You Need Anything After Your Visit  If you have any questions or concerns for your doctor, please call our main line at [phone] and press option 4 to reach your doctor's medical assistant. If no one answers, please leave a voicemail as directed and we will return your call as soon as possible. Messages left after 4 pm will be answered the following business day.   You may also send us a message via MyChart. We typically respond to MyChart messages within 1-2 business days.  For prescription refills, please ask your pharmacy to contact our office. Our fax number is [phone].  If you have an urgent issue when the clinic is closed that cannot wait until the next business day, you can page your doctor at the number below.    Please note that while we do our best to be available for urgent issues outside of office hours, we are not available 24/7.   If you have an urgent issue and are unable to reach us, you may choose to seek medical care at your doctor's office, retail clinic, urgent care center, or emergency room.  If you have a medical emergency, please immediately call 911 or go to the emergency department.  Pager Numbers  - Dr. Kowalski: [phone]  - Dr. Moye: [phone]  - Dr. Stewart: [phone]  In the event of inclement weather, please call our main line at   [phone] for an update on the status of any delays or closures.  Dermatology Medication Tips: Please keep the boxes that topical medications come in in order to help keep track of the instructions about where and how to use these. Pharmacies typically print the medication instructions only on the boxes and not directly on the medication tubes.   If your medication is too expensive, please contact our office at [phone] option 4 or send us a message through MyChart.   We are unable to tell what your co-pay for medications will be in  advance as this is different depending on your insurance coverage. However, we may be able to find a substitute medication at lower cost or fill out paperwork to get insurance to cover a needed medication.   If a prior authorization is required to get your medication covered by your insurance company, please allow us 1-2 business days to complete this process.  Drug prices often vary depending on where the prescription is filled and some pharmacies may offer cheaper prices.  The website www.goodrx.com contains coupons for medications through different pharmacies. The prices here do not account for what the cost may be with help from insurance (it may be cheaper with your insurance), but the website can give you the price if you did not use any insurance.  - You can print the associated coupon and take it with your prescription to the pharmacy.  - You may also stop by our office during regular business hours and pick up a GoodRx coupon card.  - If you need your prescription sent electronically to a different pharmacy, notify our office through [facility] MyChart or by phone at [phone] option 4.     Si Usted Necesita Algo Despus de Su Visita  Tambin puede enviarnos un mensaje a travs de MyChart. Por lo general respondemos a los mensajes de MyChart en el transcurso de 1 a 2 das hbiles.  Para renovar recetas, por favor pida a su farmacia que se ponga en contacto con nuestra oficina. Nuestro nmero de fax es el [phone].  Si tiene un asunto urgente cuando la clnica est cerrada y que no puede esperar hasta el siguiente da hbil, puede llamar/localizar a su doctor(a) al nmero que aparece a continuacin.   Por favor, tenga en cuenta que aunque hacemos todo lo posible para estar disponibles para asuntos urgentes fuera del horario de oficina, no estamos disponibles las 24 horas del da, los 7 das de la semana.   Si tiene un problema urgente y no puede comunicarse con nosotros, puede  optar por buscar atencin mdica  en el consultorio de su doctor(a), en una clnica privada, en un centro de atencin urgente o en una sala de emergencias.  Si tiene una emergencia mdica, por favor llame inmediatamente al 911 o vaya a la sala de emergencias.  Nmeros de bper  - Dr. Kowalski: [phone]  - Dra. Moye: [phone]  - Dra. Stewart: [phone]  En caso de inclemencias del tiempo, por favor llame a nuestra lnea principal al [phone] para una actualizacin sobre el estado de cualquier retraso o cierre.  Consejos para la medicacin en dermatologa: Por favor, guarde las cajas en las que vienen los medicamentos de uso tpico para ayudarle a seguir las instrucciones sobre dnde y cmo usarlos. Las farmacias generalmente imprimen las instrucciones del medicamento slo en las cajas y no directamente en los tubos del medicamento.   Si su medicamento es muy caro, por favor, pngase en contacto con   nuestra oficina llamando al [phone] y presione la opcin 4 o envenos un mensaje a travs de MyChart.   No podemos decirle cul ser su copago por los medicamentos por adelantado ya que esto es diferente dependiendo de la cobertura de su seguro. Sin embargo, es posible que podamos encontrar un medicamento sustituto a menor costo o llenar un formulario para que el seguro cubra el medicamento que se considera necesario.   Si se requiere una autorizacin previa para que su compaa de seguros cubra su medicamento, por favor permtanos de 1 a 2 das hbiles para completar este proceso.  Los precios de los medicamentos varan con frecuencia dependiendo del lugar de dnde se surte la receta y alguna farmacias pueden ofrecer precios ms baratos.  El sitio web www.goodrx.com tiene cupones para medicamentos de diferentes farmacias. Los precios aqu no tienen en cuenta lo que podra costar con la ayuda del seguro (puede ser ms barato con su seguro), pero el sitio web puede darle el  precio si no utiliz ningn seguro.  - Puede imprimir el cupn correspondiente y llevarlo con su receta a la farmacia.  - Tambin puede pasar por nuestra oficina durante el horario de atencin regular y recoger una tarjeta de cupones de GoodRx.  - Si necesita que su receta se enve electrnicamente a una farmacia diferente, informe a nuestra oficina a travs de MyChart de  o por telfono llamando al [phone] y presione la opcin 4.  

## 2022-06-10 NOTE — Progress Notes (Signed)
   Follow-Up Visit   Subjective  [name] is a 81 y.o. male who presents for the following: Follow-up (Recheck biopsy site at the left lower ear helix ). Still bleeding  Wife with patient   The following portions of the chart were reviewed this encounter and updated as appropriate:       Review of Systems:  No other skin or systemic complaints except as noted in HPI or Assessment and Plan.  Objective  Well appearing patient in no apparent distress; mood and affect are within normal limits.  A focused examination was performed including left ear helix. Relevant physical exam findings are noted in the Assessment and Plan.  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  Destruction of lesion  Destruction method: electrodesiccation and curettage   Informed consent: discussed and consent obtained   Timeout:  patient name, date of birth, surgical site, and procedure verified Anesthesia: the lesion was anesthetized in a standard fashion   Anesthetic:  1% lidocaine w/ epinephrine 1-100,000 buffered w/ 8.4% NaHCO3 Curettage performed in three different directions: Yes   Electrodesiccation performed over the curetted area: Yes   Final wound size (cm):  1.1 Hemostasis achieved with:  electrodesiccation Outcome: patient tolerated procedure well with no complications   Post-procedure details: sterile dressing applied and wound care instructions given   Dressing type: petrolatum and bandage    Biopsy proven BCC  Discussed BCC skin cancer, needs additional treatment. EDC would leave a round depressed whitish scar about the same size as the original lesion.  It is treated here in office in a procedure we call "scrape and burn".  No further pathology would be performed.  Mid to high 80% cure rate.  Mohs would leave a linear scar and pathology would be done at time of procedure to ensure  complete removal.  It has a high 90s% cure rate  Patient would like EDC in the office today   Actinic Damage - chronic, secondary to cumulative UV radiation exposure/sun exposure over time - diffuse scaly erythematous macules with underlying dyspigmentation - Recommend daily broad spectrum sunscreen SPF 30+ to sun-exposed areas, reapply every 2 hours as needed.  - Recommend staying in the shade or wearing long sleeves, sun glasses (UVA+UVB protection) and wide brim hats (4-inch brim around the entire circumference of the hat). - Call for new or changing lesions.   Return in about 3 months (around 09/10/2022) for recheck [facility] site .  I, Marye Round, CMA, am acting as scribe for Brendolyn Patty, MD .   Documentation: I have reviewed the above documentation for accuracy and completeness, and I agree with the above.  Brendolyn Patty MD

## 2022-06-10 NOTE — Telephone Encounter (Signed)
Spouse called regarding patient's ear. She states at night it will bleed and she has been doing directed wound care. I asked spouse was it a dot of blood or a lot and she responded there was a lot of blood in multiple places. Would you like to bring patient in to check this area?

## 2022-06-24 ENCOUNTER — Ambulatory Visit: Payer: Medicare HMO | Admitting: Dermatology

## 2022-08-12 ENCOUNTER — Ambulatory Visit: Payer: Medicare HMO | Admitting: Dermatology

## 2022-08-12 DIAGNOSIS — C44219 Basal cell carcinoma of skin of left ear and external auricular canal: Secondary | ICD-10-CM

## 2022-08-12 DIAGNOSIS — L578 Other skin changes due to chronic exposure to nonionizing radiation: Secondary | ICD-10-CM | POA: Diagnosis not present

## 2022-08-12 DIAGNOSIS — Z85828 Personal history of other malignant neoplasm of skin: Secondary | ICD-10-CM | POA: Diagnosis not present

## 2022-08-12 NOTE — Progress Notes (Signed)
   Follow-Up Visit   Subjective  [name] is a 82 y.o. male who presents for the following: Follow-up.  Patient presents for 2 month follow-up BCC of the left lower ear helix. Area was treated with EDC.  Healed up well.  No symptoms.  The following portions of the chart were reviewed this encounter and updated as appropriate:       Review of Systems:  No other skin or systemic complaints except as noted in HPI or Assessment and Plan.  Objective  Well appearing patient in no apparent distress; mood and affect are within normal limits.  A focused examination was performed including face, ears. Relevant physical exam findings are noted in the Assessment and Plan.  left lower ear helix Well Healed EDC site. Clear    Assessment & Plan  Actinic Damage - chronic, secondary to cumulative UV radiation exposure/sun exposure over time - diffuse scaly erythematous macules with underlying dyspigmentation - Recommend daily broad spectrum sunscreen SPF 30+ to sun-exposed areas, reapply every 2 hours as needed.  - Recommend staying in the shade or wearing long sleeves, sun glasses (UVA+UVB protection) and wide brim hats (4-inch brim around the entire circumference of the hat). - Call for new or changing lesions.  Basal cell carcinoma of skin of left ear and external auricular canal left lower ear helix  Clear s/p EDC. No evidence of recurrence. Observe for recurrence. Call clinic for new or changing lesions.  Recommend regular skin exams, daily broad-spectrum spf 30+ sunscreen use, and photoprotection.      Return as scheduled, for UBSE, Hx BCC.  IJamesetta Orleans, CMA, am acting as scribe for Brendolyn Patty, MD .  Documentation: I have reviewed the above documentation for accuracy and completeness, and I agree with the above.  Brendolyn Patty MD

## 2022-08-12 NOTE — Patient Instructions (Signed)
Due to recent changes in healthcare laws, you may see results of your pathology and/or laboratory studies on MyChart before the doctors have had a chance to review them. We understand that in some cases there may be results that are confusing or concerning to you. Please understand that not all results are received at the same time and often the doctors may need to interpret multiple results in order to provide you with the best plan of care or course of treatment. Therefore, we ask that you please give us 2 business days to thoroughly review all your results before contacting the office for clarification. Should we see a critical lab result, you will be contacted sooner.   If You Need Anything After Your Visit  If you have any questions or concerns for your doctor, please call our main line at [phone] and press option 4 to reach your doctor's medical assistant. If no one answers, please leave a voicemail as directed and we will return your call as soon as possible. Messages left after 4 pm will be answered the following business day.   You may also send us a message via MyChart. We typically respond to MyChart messages within 1-2 business days.  For prescription refills, please ask your pharmacy to contact our office. Our fax number is [phone].  If you have an urgent issue when the clinic is closed that cannot wait until the next business day, you can page your doctor at the number below.    Please note that while we do our best to be available for urgent issues outside of office hours, we are not available 24/7.   If you have an urgent issue and are unable to reach us, you may choose to seek medical care at your doctor's office, retail clinic, urgent care center, or emergency room.  If you have a medical emergency, please immediately call 911 or go to the emergency department.  Pager Numbers  - Dr. Kowalski: [phone]  - Dr. Moye: [phone]  - Dr. Stewart:  [phone]  In the event of inclement weather, please call our main line at [phone] for an update on the status of any delays or closures.  Dermatology Medication Tips: Please keep the boxes that topical medications come in in order to help keep track of the instructions about where and how to use these. Pharmacies typically print the medication instructions only on the boxes and not directly on the medication tubes.   If your medication is too expensive, please contact our office at [phone] option 4 or send us a message through MyChart.   We are unable to tell what your co-pay for medications will be in advance as this is different depending on your insurance coverage. However, we may be able to find a substitute medication at lower cost or fill out paperwork to get insurance to cover a needed medication.   If a prior authorization is required to get your medication covered by your insurance company, please allow us 1-2 business days to complete this process.  Drug prices often vary depending on where the prescription is filled and some pharmacies may offer cheaper prices.  The website www.goodrx.com contains coupons for medications through different pharmacies. The prices here do not account for what the cost may be with help from insurance (it may be cheaper with your insurance), but the website can give you the price if you did not use any insurance.  - You can print the associated coupon and take it with   your prescription to the pharmacy.  - You may also stop by our office during regular business hours and pick up a GoodRx coupon card.  - If you need your prescription sent electronically to a different pharmacy, notify our office through [facility] MyChart or by phone at [phone] option 4.     Si Usted Necesita Algo Despus de Su Visita  Tambin puede enviarnos un mensaje a travs de MyChart. Por lo general respondemos a los mensajes de MyChart en el transcurso de 1 a 2  das hbiles.  Para renovar recetas, por favor pida a su farmacia que se ponga en contacto con nuestra oficina. Nuestro nmero de fax es el [phone].  Si tiene un asunto urgente cuando la clnica est cerrada y que no puede esperar hasta el siguiente da hbil, puede llamar/localizar a su doctor(a) al nmero que aparece a continuacin.   Por favor, tenga en cuenta que aunque hacemos todo lo posible para estar disponibles para asuntos urgentes fuera del horario de oficina, no estamos disponibles las 24 horas del da, los 7 das de la semana.   Si tiene un problema urgente y no puede comunicarse con nosotros, puede optar por buscar atencin mdica  en el consultorio de su doctor(a), en una clnica privada, en un centro de atencin urgente o en una sala de emergencias.  Si tiene una emergencia mdica, por favor llame inmediatamente al 911 o vaya a la sala de emergencias.  Nmeros de bper  - Dr. Kowalski: [phone]  - Dra. Moye: [phone]  - Dra. Stewart: [phone]  En caso de inclemencias del tiempo, por favor llame a nuestra lnea principal al [phone] para una actualizacin sobre el estado de cualquier retraso o cierre.  Consejos para la medicacin en dermatologa: Por favor, guarde las cajas en las que vienen los medicamentos de uso tpico para ayudarle a seguir las instrucciones sobre dnde y cmo usarlos. Las farmacias generalmente imprimen las instrucciones del medicamento slo en las cajas y no directamente en los tubos del medicamento.   Si su medicamento es muy caro, por favor, pngase en contacto con nuestra oficina llamando al [phone] y presione la opcin 4 o envenos un mensaje a travs de MyChart.   No podemos decirle cul ser su copago por los medicamentos por adelantado ya que esto es diferente dependiendo de la cobertura de su seguro. Sin embargo, es posible que podamos encontrar un medicamento sustituto a menor costo o llenar un formulario para que el  seguro cubra el medicamento que se considera necesario.   Si se requiere una autorizacin previa para que su compaa de seguros cubra su medicamento, por favor permtanos de 1 a 2 das hbiles para completar este proceso.  Los precios de los medicamentos varan con frecuencia dependiendo del lugar de dnde se surte la receta y alguna farmacias pueden ofrecer precios ms baratos.  El sitio web www.goodrx.com tiene cupones para medicamentos de diferentes farmacias. Los precios aqu no tienen en cuenta lo que podra costar con la ayuda del seguro (puede ser ms barato con su seguro), pero el sitio web puede darle el precio si no utiliz ningn seguro.  - Puede imprimir el cupn correspondiente y llevarlo con su receta a la farmacia.  - Tambin puede pasar por nuestra oficina durante el horario de atencin regular y recoger una tarjeta de cupones de GoodRx.  - Si necesita que su receta se enve electrnicamente a una farmacia diferente, informe a nuestra oficina a travs de MyChart de [place]   o por telfono llamando al [phone] y presione la opcin 4.  

## 2022-12-02 ENCOUNTER — Encounter: Payer: Medicare HMO | Admitting: Dermatology

## 2022-12-10 ENCOUNTER — Other Ambulatory Visit: Payer: Self-pay | Admitting: Sports Medicine

## 2022-12-10 DIAGNOSIS — M5136 Other intervertebral disc degeneration, lumbar region: Secondary | ICD-10-CM

## 2022-12-10 DIAGNOSIS — M4716 Other spondylosis with myelopathy, lumbar region: Secondary | ICD-10-CM

## 2022-12-21 ENCOUNTER — Ambulatory Visit
Admission: RE | Admit: 2022-12-21 | Discharge: 2022-12-21 | Disposition: A | Discharge status: Home / Self Care | Payer: Medicare HMO | Source: Ambulatory Visit | Attending: Sports Medicine | Admitting: Sports Medicine

## 2022-12-21 DIAGNOSIS — M4716 Other spondylosis with myelopathy, lumbar region: Secondary | ICD-10-CM

## 2022-12-21 DIAGNOSIS — M5136 Other intervertebral disc degeneration, lumbar region: Secondary | ICD-10-CM

## 2022-12-23 ENCOUNTER — Ambulatory Visit: Payer: Medicare HMO | Admitting: Podiatry

## 2022-12-24 IMAGING — DX DG CHEST 1V PORT
1 series · 1 of 1 positions shown · non-contrast
Comparison: Chest radiograph 11/11/2006

CLINICAL DATA: Chest pain

EXAM:
PORTABLE CHEST 1 VIEW

[chest ap]
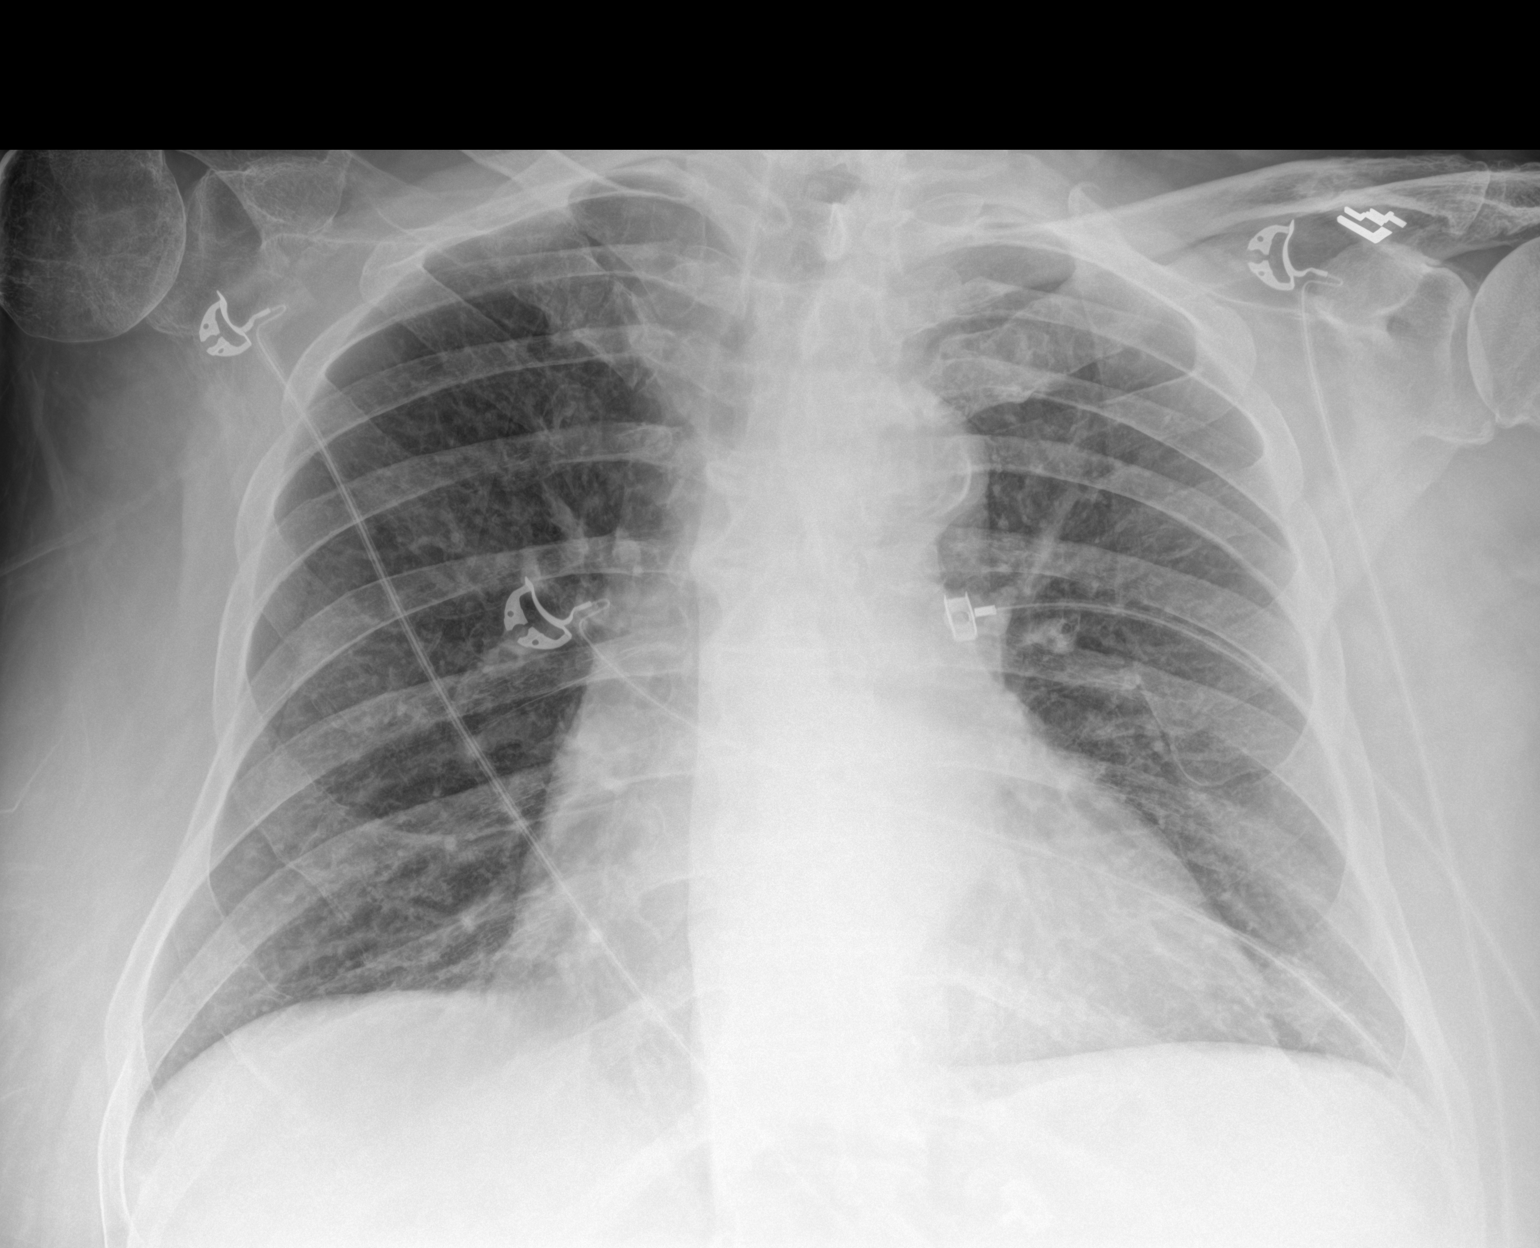

[1 of 1 positions shown; findings below may reference images not displayed]

FINDINGS: The heart is mildly enlarged. The mediastinal contours are within
normal limits. There is calcified atherosclerotic plaque of the
aortic arch.

There is no focal consolidation or pulmonary edema. There is no
pleural effusion or pneumothorax.

There is no acute osseous abnormality. There is unchanged widening
of the right AC joint.
IMPRESSION: Cardiomegaly. Otherwise, no radiographic evidence of acute
cardiopulmonary process.

## 2022-12-24 IMAGING — CT CT HEAD W/O CM
3 series · 16 of 47 positions shown, 19 images · non-contrast
Comparison: Brain MRI 12/24/2016

CLINICAL DATA: Headache

EXAM:
CT HEAD WITHOUT CONTRAST
TECHNIQUE: Contiguous axial images were obtained from the base of the skull
through the vertex without intravenous contrast.

[Series 2: head wo · axial · 0.45mm/px · z∈[-98,+32]mm · 10 of 32 slices shown, 13 images]
[im 3/32  brain]
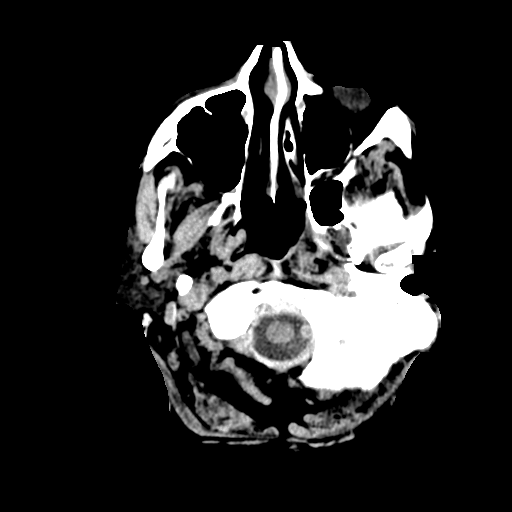
[im 3/32  bone]
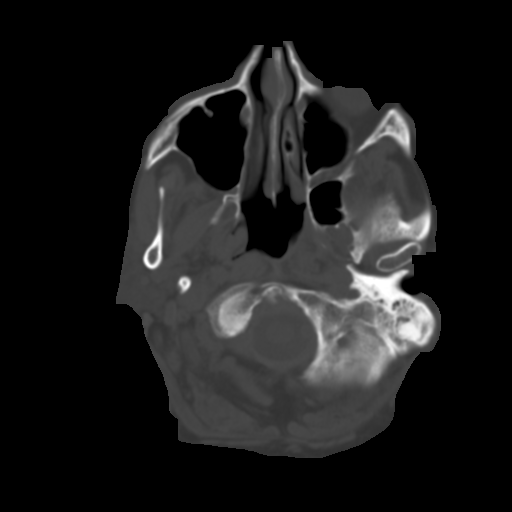
[im 6/32  brain]
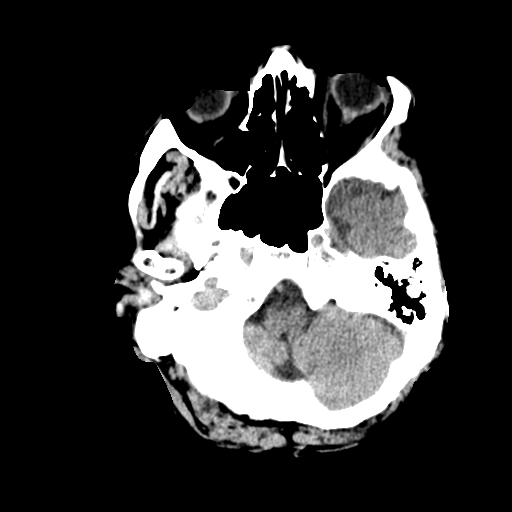
[im 9/32  brain]
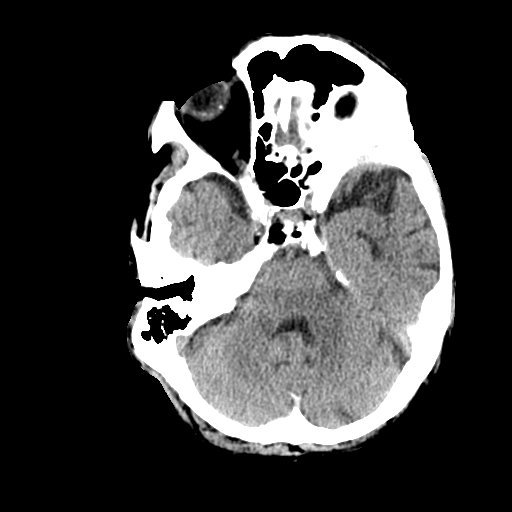
[im 11/32  brain]
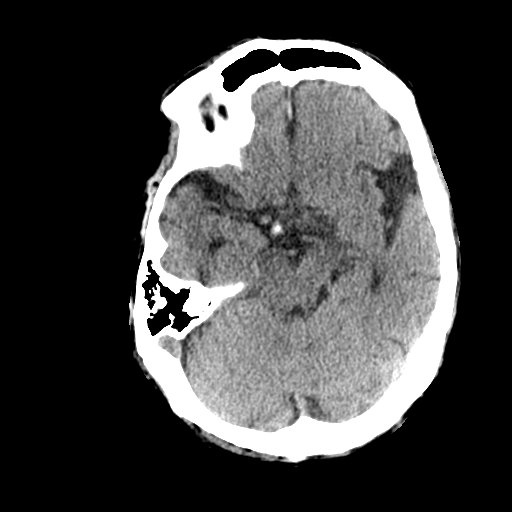
[im 14/32  brain]
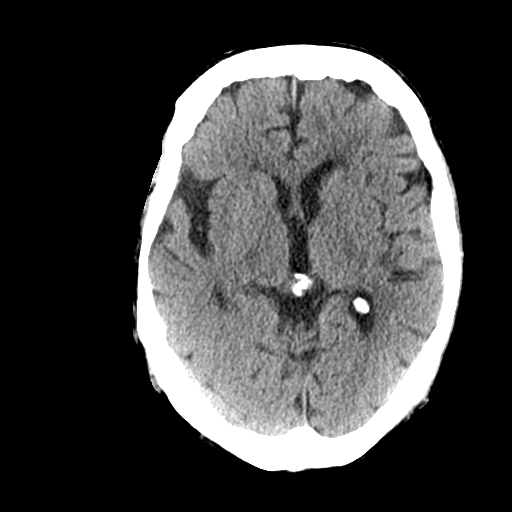
[im 14/32  bone]
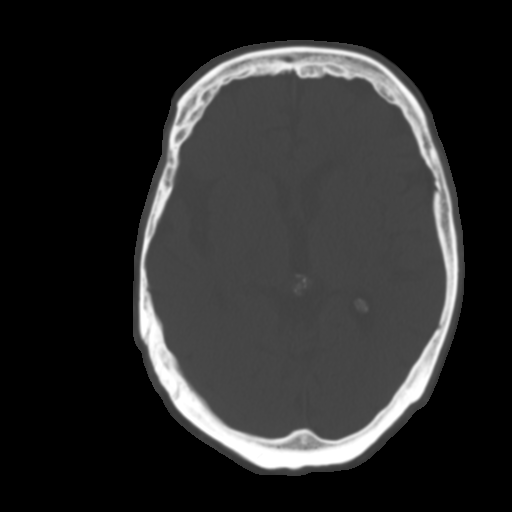
[im 18/32  brain]
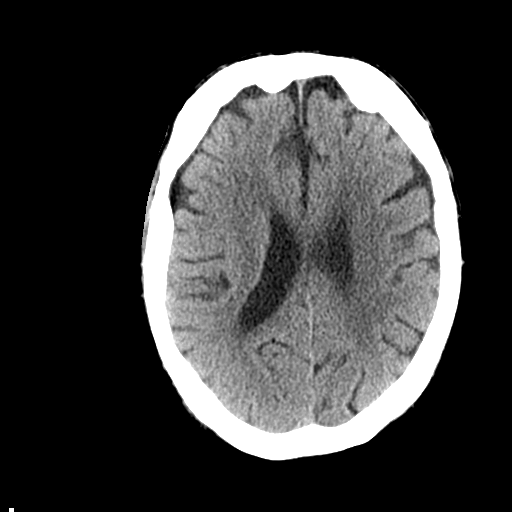
[im 21/32  brain]
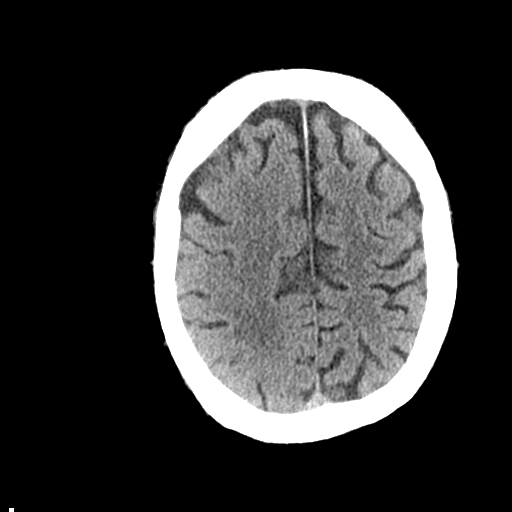
[im 24/32  brain]
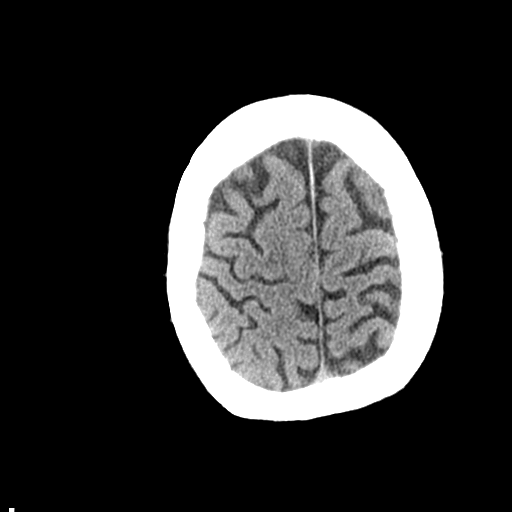
[im 26/32  brain]
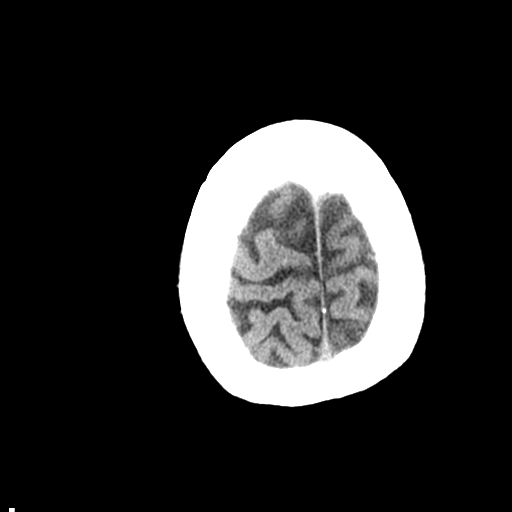
[im 26/32  bone]
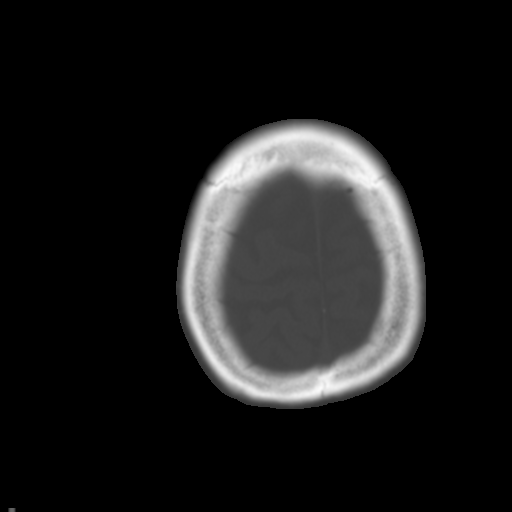
[im 29/32  brain]
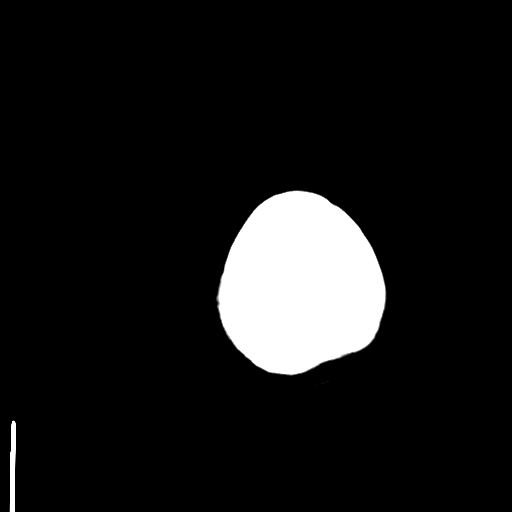

[Series 4: coronal soft tissue · coronal · 0.33mm/px · 3 of 73 slices shown]
[im 25/73  brain]
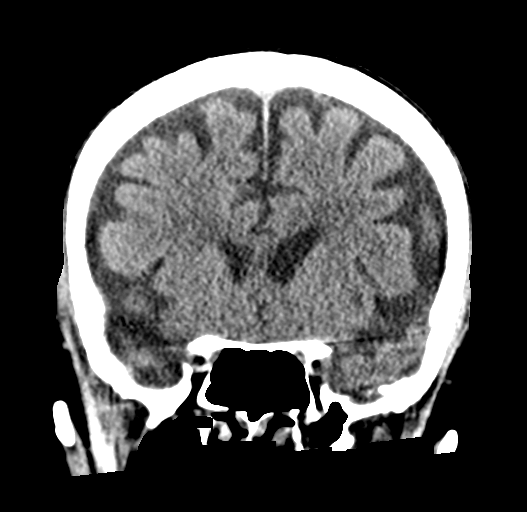
[im 33/73  brain]
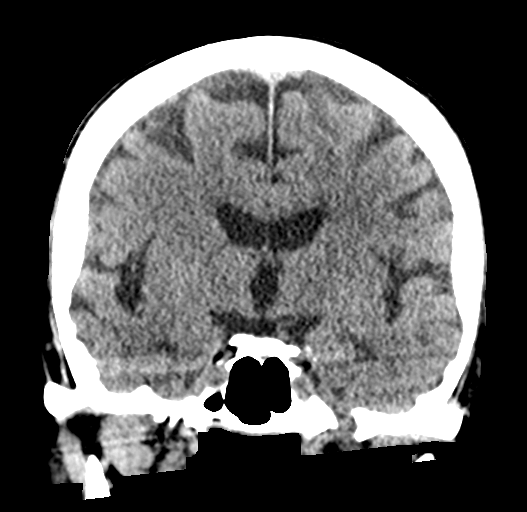
[im 41/73  brain]
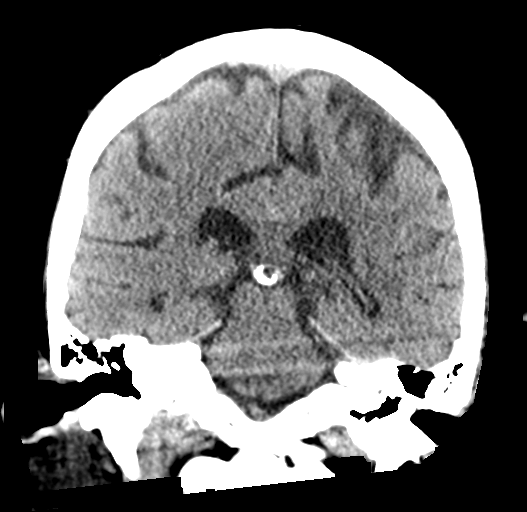

[Series 5: sagittal soft tissue · sagittal · 0.36mm/px · 3 of 56 slices shown]
[im 19/56  brain]
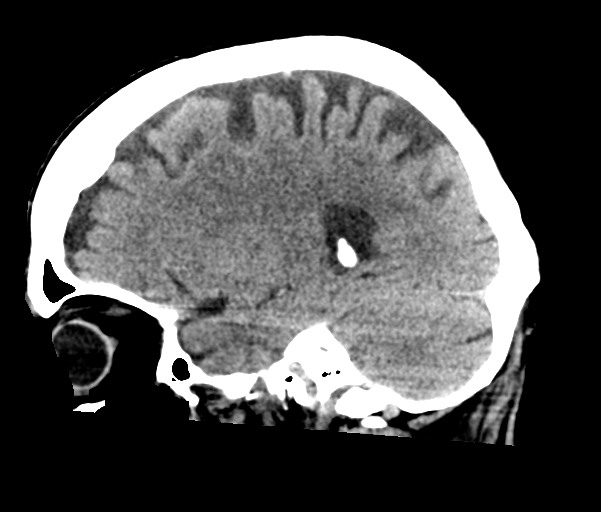
[im 28/56  brain]
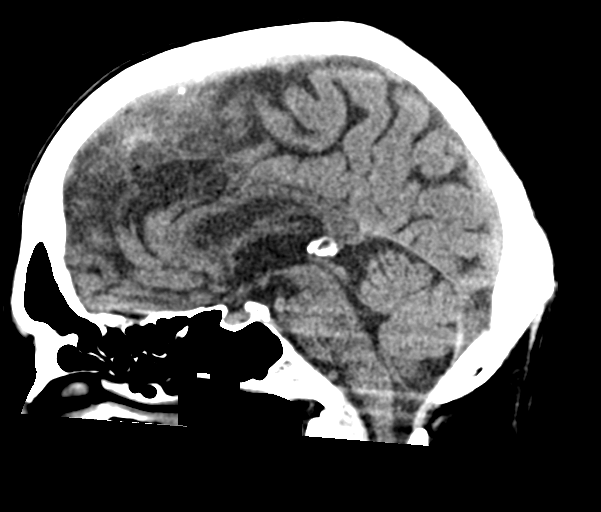
[im 37/56  brain]
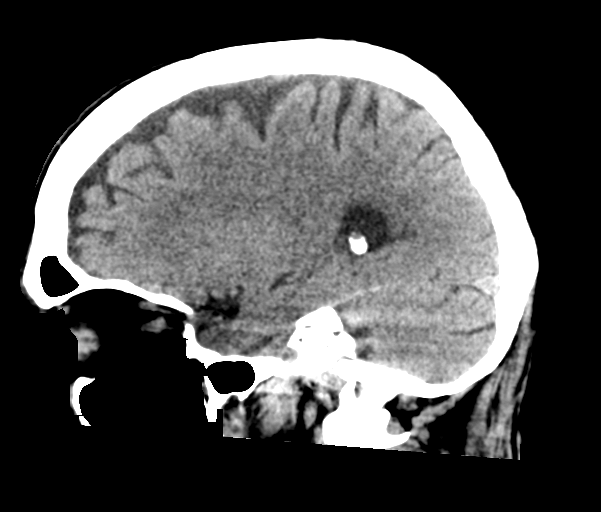

[16 of 47 positions shown; findings below may reference images not displayed]

FINDINGS: Brain: There is no acute intracranial hemorrhage, extra-axial fluid
collection, or acute infarct.

There is mild parenchymal volume loss. There is no significant
burden of chronic white matter microangiopathy. There is no mass
lesion. There is no midline shift.

Vascular: There is calcification of the bilateral cavernous ICAs.

Skull: Normal. Negative for fracture or focal lesion.

Sinuses/Orbits: The imaged paranasal sinuses are clear. Bilateral
lens implants are in place. The globes and orbits are otherwise
unremarkable.

Other: None.
IMPRESSION: No acute intracranial pathology.

## 2023-06-02 ENCOUNTER — Other Ambulatory Visit: Payer: Self-pay | Admitting: Orthopedic Surgery

## 2023-06-02 DIAGNOSIS — T84031A Mechanical loosening of internal left hip prosthetic joint, initial encounter: Secondary | ICD-10-CM

## 2023-06-03 ENCOUNTER — Ambulatory Visit
Admission: RE | Admit: 2023-06-03 | Discharge: 2023-06-03 | Disposition: A | Discharge status: Home / Self Care | Payer: Medicare HMO | Source: Ambulatory Visit | Attending: Internal Medicine | Admitting: Internal Medicine

## 2023-06-03 ENCOUNTER — Other Ambulatory Visit: Payer: Self-pay | Admitting: Internal Medicine

## 2023-06-03 DIAGNOSIS — S0990XA Unspecified injury of head, initial encounter: Secondary | ICD-10-CM | POA: Insufficient documentation

## 2023-06-03 DIAGNOSIS — Z041 Encounter for examination and observation following transport accident: Secondary | ICD-10-CM | POA: Insufficient documentation

## 2023-06-03 DIAGNOSIS — M519 Unspecified thoracic, thoracolumbar and lumbosacral intervertebral disc disorder: Secondary | ICD-10-CM | POA: Diagnosis present

## 2023-06-06 ENCOUNTER — Emergency Department: Payer: Medicare HMO

## 2023-06-06 ENCOUNTER — Other Ambulatory Visit: Payer: Self-pay

## 2023-06-06 ENCOUNTER — Emergency Department
Admission: EM | Admit: 2023-06-06 | Discharge: 2023-06-06 | Disposition: A | Discharge status: Home / Self Care | Payer: Medicare HMO | Attending: Emergency Medicine | Admitting: Emergency Medicine

## 2023-06-06 DIAGNOSIS — R1010 Upper abdominal pain, unspecified: Secondary | ICD-10-CM | POA: Insufficient documentation

## 2023-06-06 DIAGNOSIS — S00432A Contusion of left ear, initial encounter: Secondary | ICD-10-CM | POA: Diagnosis not present

## 2023-06-06 DIAGNOSIS — Y92410 Unspecified street and highway as the place of occurrence of the external cause: Secondary | ICD-10-CM | POA: Diagnosis not present

## 2023-06-06 DIAGNOSIS — I700 Atherosclerosis of aorta: Secondary | ICD-10-CM | POA: Insufficient documentation

## 2023-06-06 DIAGNOSIS — S0990XA Unspecified injury of head, initial encounter: Secondary | ICD-10-CM | POA: Diagnosis not present

## 2023-06-06 DIAGNOSIS — I2510 Atherosclerotic heart disease of native coronary artery without angina pectoris: Secondary | ICD-10-CM | POA: Diagnosis not present

## 2023-06-06 DIAGNOSIS — Z7901 Long term (current) use of anticoagulants: Secondary | ICD-10-CM | POA: Diagnosis not present

## 2023-06-06 DIAGNOSIS — M25512 Pain in left shoulder: Secondary | ICD-10-CM | POA: Insufficient documentation

## 2023-06-06 DIAGNOSIS — R0789 Other chest pain: Secondary | ICD-10-CM | POA: Insufficient documentation

## 2023-06-06 DIAGNOSIS — S0991XA Unspecified injury of ear, initial encounter: Secondary | ICD-10-CM | POA: Diagnosis present

## 2023-06-06 DIAGNOSIS — I10 Essential (primary) hypertension: Secondary | ICD-10-CM | POA: Diagnosis not present

## 2023-06-06 LAB — CBC WITH DIFFERENTIAL/PLATELET
Abs Immature Granulocytes: 0.02 10*3/uL (ref 0.00–0.07)
Basophils Absolute: 0 10*3/uL (ref 0.0–0.1)
Basophils Relative: 0 %
Eosinophils Absolute: 0.1 10*3/uL (ref 0.0–0.5)
Eosinophils Relative: 2 %
HCT: 32.6 % — ABNORMAL LOW (ref 39.0–52.0)
Hemoglobin: 10.9 g/dL — ABNORMAL LOW (ref 13.0–17.0)
Immature Granulocytes: 0 %
Lymphocytes Relative: 17 %
Lymphs Abs: 0.9 10*3/uL (ref 0.7–4.0)
MCH: 31.9 pg (ref 26.0–34.0)
MCHC: 33.4 g/dL (ref 30.0–36.0)
MCV: 95.3 fL (ref 80.0–100.0)
Monocytes Absolute: 0.5 10*3/uL (ref 0.1–1.0)
Monocytes Relative: 9 %
Neutro Abs: 3.7 10*3/uL (ref 1.7–7.7)
Neutrophils Relative %: 72 %
Platelets: 131 10*3/uL — ABNORMAL LOW (ref 150–400)
RBC: 3.42 MIL/uL — ABNORMAL LOW (ref 4.22–5.81)
RDW: 12.5 % (ref 11.5–15.5)
WBC: 5.1 10*3/uL (ref 4.0–10.5)
nRBC: 0 % (ref 0.0–0.2)

## 2023-06-06 LAB — COMPREHENSIVE METABOLIC PANEL
ALT: 13 U/L (ref 0–44)
AST: 18 U/L (ref 15–41)
Albumin: 3.8 g/dL (ref 3.5–5.0)
Alkaline Phosphatase: 42 U/L (ref 38–126)
Anion gap: 10 (ref 5–15)
BUN: 21 mg/dL (ref 8–23)
CO2: 23 mmol/L (ref 22–32)
Calcium: 8.6 mg/dL — ABNORMAL LOW (ref 8.9–10.3)
Chloride: 104 mmol/L (ref 98–111)
Creatinine, Ser: 1.05 mg/dL (ref 0.61–1.24)
GFR, Estimated: >60 mL/min (ref >60)
Glucose, Bld: 113 mg/dL — ABNORMAL HIGH (ref 70–99)
Potassium: 3.7 mmol/L (ref 3.5–5.1)
Sodium: 137 mmol/L (ref 135–145)
Total Bilirubin: 1.2 mg/dL — ABNORMAL HIGH (ref <1.2)
Total Protein: 6.3 g/dL — ABNORMAL LOW (ref 6.5–8.1)

## 2023-06-06 MED ORDER — CIPROFLOXACIN-DEXAMETHASONE 0.3-0.1 % OT SUSP: 4.0000 [drp] | Freq: Two times a day (BID) | OTIC | 0 refills | Status: DC

## 2023-06-06 MED ORDER — AMOXICILLIN-POT CLAVULANATE 875-125 MG PO TABS: 1.0000 | ORAL_TABLET | Freq: Two times a day (BID) | ORAL | 0 refills | Status: AC

## 2023-06-06 MED ORDER — ACETAMINOPHEN 325 MG PO TABS
650.0000 mg | ORAL_TABLET | Freq: Once | ORAL | Status: AC
  Administered 2023-06-06: 650 mg via ORAL
  Filled 2023-06-06: qty 2

## 2023-06-06 MED ORDER — LIDOCAINE 5 % EX PTCH
1.0000 | MEDICATED_PATCH | CUTANEOUS | Status: DC
  Administered 2023-06-06: 1 via TRANSDERMAL
  Filled 2023-06-06: qty 1

## 2023-06-06 MED ORDER — IOHEXOL 300 MG/ML  SOLN
100.0000 mL | Freq: Once | INTRAMUSCULAR | Status: AC | PRN
  Administered 2023-06-06: 100 mL via INTRAVENOUS

## 2023-06-06 NOTE — Discharge Instructions (Addendum)
Your x-rays and CT scans were normal.  Please take the antibiotic for your ear.  Please follow-up with ENT.  Please return for any new, worsening, or change in symptoms or other concerns.  It was a pleasure caring for you today.
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
## 2023-06-25 ENCOUNTER — Ambulatory Visit
Admission: RE | Admit: 2023-06-25 | Discharge: 2023-06-25 | Disposition: A | Discharge status: Home / Self Care | Payer: Medicare HMO | Source: Ambulatory Visit | Attending: Orthopedic Surgery | Admitting: Orthopedic Surgery

## 2023-06-25 DIAGNOSIS — T84031A Mechanical loosening of internal left hip prosthetic joint, initial encounter: Secondary | ICD-10-CM

## 2023-07-10 DIAGNOSIS — M48061 Spinal stenosis, lumbar region without neurogenic claudication: Secondary | ICD-10-CM | POA: Insufficient documentation

## 2023-07-28 DIAGNOSIS — R0609 Other forms of dyspnea: Secondary | ICD-10-CM | POA: Diagnosis not present

## 2023-07-28 DIAGNOSIS — D500 Iron deficiency anemia secondary to blood loss (chronic): Secondary | ICD-10-CM | POA: Diagnosis not present

## 2023-07-28 DIAGNOSIS — R5383 Other fatigue: Secondary | ICD-10-CM | POA: Diagnosis not present

## 2023-07-28 DIAGNOSIS — R634 Abnormal weight loss: Secondary | ICD-10-CM | POA: Diagnosis not present

## 2023-07-29 DIAGNOSIS — M25552 Pain in left hip: Secondary | ICD-10-CM | POA: Diagnosis not present

## 2023-07-30 ENCOUNTER — Other Ambulatory Visit: Payer: Self-pay | Admitting: Internal Medicine

## 2023-07-30 DIAGNOSIS — D500 Iron deficiency anemia secondary to blood loss (chronic): Secondary | ICD-10-CM

## 2023-07-30 DIAGNOSIS — R634 Abnormal weight loss: Secondary | ICD-10-CM

## 2023-08-01 ENCOUNTER — Ambulatory Visit
Admission: RE | Admit: 2023-08-01 | Discharge: 2023-08-01 | Disposition: A | Discharge status: Home / Self Care | Payer: HMO | Source: Ambulatory Visit | Attending: Internal Medicine | Admitting: Internal Medicine

## 2023-08-01 ENCOUNTER — Other Ambulatory Visit
Admission: RE | Admit: 2023-08-01 | Discharge: 2023-08-01 | Disposition: A | Discharge status: Home / Self Care | Payer: HMO | Source: Ambulatory Visit | Attending: Internal Medicine | Admitting: Internal Medicine

## 2023-08-01 ENCOUNTER — Other Ambulatory Visit: Payer: Self-pay | Admitting: Internal Medicine

## 2023-08-01 DIAGNOSIS — E782 Mixed hyperlipidemia: Secondary | ICD-10-CM | POA: Diagnosis not present

## 2023-08-01 DIAGNOSIS — I700 Atherosclerosis of aorta: Secondary | ICD-10-CM | POA: Diagnosis not present

## 2023-08-01 DIAGNOSIS — I252 Old myocardial infarction: Secondary | ICD-10-CM | POA: Diagnosis not present

## 2023-08-01 DIAGNOSIS — R0602 Shortness of breath: Secondary | ICD-10-CM | POA: Insufficient documentation

## 2023-08-01 DIAGNOSIS — R600 Localized edema: Secondary | ICD-10-CM | POA: Diagnosis not present

## 2023-08-01 DIAGNOSIS — Z955 Presence of coronary angioplasty implant and graft: Secondary | ICD-10-CM | POA: Diagnosis not present

## 2023-08-01 DIAGNOSIS — I4892 Unspecified atrial flutter: Secondary | ICD-10-CM | POA: Diagnosis not present

## 2023-08-01 DIAGNOSIS — K219 Gastro-esophageal reflux disease without esophagitis: Secondary | ICD-10-CM | POA: Diagnosis not present

## 2023-08-01 DIAGNOSIS — R079 Chest pain, unspecified: Secondary | ICD-10-CM | POA: Diagnosis not present

## 2023-08-01 DIAGNOSIS — I2510 Atherosclerotic heart disease of native coronary artery without angina pectoris: Secondary | ICD-10-CM | POA: Diagnosis not present

## 2023-08-01 DIAGNOSIS — I10 Essential (primary) hypertension: Secondary | ICD-10-CM | POA: Diagnosis not present

## 2023-08-01 DIAGNOSIS — I4891 Unspecified atrial fibrillation: Secondary | ICD-10-CM | POA: Diagnosis not present

## 2023-08-01 DIAGNOSIS — I480 Paroxysmal atrial fibrillation: Secondary | ICD-10-CM | POA: Diagnosis not present

## 2023-08-01 DIAGNOSIS — S48911S Complete traumatic amputation of right shoulder and upper arm, level unspecified, sequela: Secondary | ICD-10-CM | POA: Diagnosis not present

## 2023-08-01 LAB — D-DIMER, QUANTITATIVE: D-Dimer, Quant: <0.27 ug{FEU}/mL (ref 0.00–0.50)

## 2023-08-05 ENCOUNTER — Ambulatory Visit: Admission: RE | Admit: 2023-08-05 | Payer: HMO | Source: Home / Self Care | Admitting: Internal Medicine

## 2023-08-05 ENCOUNTER — Encounter: Admission: RE | Payer: Self-pay | Source: Home / Self Care

## 2023-08-05 DIAGNOSIS — I200 Unstable angina: Secondary | ICD-10-CM

## 2023-08-05 SURGERY — RIGHT/LEFT HEART CATH AND CORONARY ANGIOGRAPHY
Anesthesia: Moderate Sedation | Laterality: Bilateral

## 2023-08-06 ENCOUNTER — Ambulatory Visit: Payer: Medicare HMO | Admitting: Dermatology

## 2023-08-07 DIAGNOSIS — D500 Iron deficiency anemia secondary to blood loss (chronic): Secondary | ICD-10-CM | POA: Diagnosis not present

## 2023-08-13 ENCOUNTER — Ambulatory Visit
Admission: RE | Admit: 2023-08-13 | Discharge: 2023-08-13 | Disposition: A | Discharge status: Home / Self Care | Payer: HMO | Source: Ambulatory Visit | Attending: Internal Medicine | Admitting: Internal Medicine

## 2023-08-13 DIAGNOSIS — D500 Iron deficiency anemia secondary to blood loss (chronic): Secondary | ICD-10-CM | POA: Insufficient documentation

## 2023-08-13 DIAGNOSIS — N400 Enlarged prostate without lower urinary tract symptoms: Secondary | ICD-10-CM | POA: Insufficient documentation

## 2023-08-13 DIAGNOSIS — K5750 Diverticulosis of both small and large intestine without perforation or abscess without bleeding: Secondary | ICD-10-CM | POA: Diagnosis not present

## 2023-08-13 DIAGNOSIS — I700 Atherosclerosis of aorta: Secondary | ICD-10-CM | POA: Insufficient documentation

## 2023-08-13 DIAGNOSIS — Q278 Other specified congenital malformations of peripheral vascular system: Secondary | ICD-10-CM | POA: Insufficient documentation

## 2023-08-13 DIAGNOSIS — R634 Abnormal weight loss: Secondary | ICD-10-CM | POA: Diagnosis not present

## 2023-08-13 DIAGNOSIS — N289 Disorder of kidney and ureter, unspecified: Secondary | ICD-10-CM | POA: Diagnosis not present

## 2023-08-13 DIAGNOSIS — Z96643 Presence of artificial hip joint, bilateral: Secondary | ICD-10-CM | POA: Insufficient documentation

## 2023-08-13 DIAGNOSIS — K5730 Diverticulosis of large intestine without perforation or abscess without bleeding: Secondary | ICD-10-CM | POA: Diagnosis not present

## 2023-08-13 DIAGNOSIS — R918 Other nonspecific abnormal finding of lung field: Secondary | ICD-10-CM | POA: Diagnosis not present

## 2023-08-13 DIAGNOSIS — R0602 Shortness of breath: Secondary | ICD-10-CM | POA: Diagnosis not present

## 2023-08-13 MED ORDER — IOHEXOL 300 MG/ML  SOLN
100.0000 mL | Freq: Once | INTRAMUSCULAR | Status: AC | PRN
  Administered 2023-08-13: 100 mL via INTRAVENOUS

## 2023-08-26 ENCOUNTER — Encounter: Payer: Self-pay | Admitting: Internal Medicine

## 2023-08-26 ENCOUNTER — Encounter
Admission: RE | Disposition: A | Discharge status: Home / Self Care | Payer: Self-pay | Source: Home / Self Care | Attending: Internal Medicine

## 2023-08-26 ENCOUNTER — Observation Stay
Admission: RE | Admit: 2023-08-26 | Discharge: 2023-08-27 | Disposition: A | Discharge status: Home / Self Care | Payer: HMO | Attending: Internal Medicine | Admitting: Internal Medicine

## 2023-08-26 ENCOUNTER — Other Ambulatory Visit: Payer: Self-pay

## 2023-08-26 DIAGNOSIS — Z7901 Long term (current) use of anticoagulants: Secondary | ICD-10-CM | POA: Diagnosis not present

## 2023-08-26 DIAGNOSIS — I10 Essential (primary) hypertension: Secondary | ICD-10-CM | POA: Insufficient documentation

## 2023-08-26 DIAGNOSIS — Z79899 Other long term (current) drug therapy: Secondary | ICD-10-CM | POA: Insufficient documentation

## 2023-08-26 DIAGNOSIS — I480 Paroxysmal atrial fibrillation: Secondary | ICD-10-CM | POA: Insufficient documentation

## 2023-08-26 DIAGNOSIS — Z955 Presence of coronary angioplasty implant and graft: Secondary | ICD-10-CM | POA: Diagnosis not present

## 2023-08-26 DIAGNOSIS — Z87891 Personal history of nicotine dependence: Secondary | ICD-10-CM | POA: Insufficient documentation

## 2023-08-26 DIAGNOSIS — I25110 Atherosclerotic heart disease of native coronary artery with unstable angina pectoris: Principal | ICD-10-CM | POA: Insufficient documentation

## 2023-08-26 DIAGNOSIS — I200 Unstable angina: Secondary | ICD-10-CM

## 2023-08-26 DIAGNOSIS — R9439 Abnormal result of other cardiovascular function study: Secondary | ICD-10-CM | POA: Diagnosis not present

## 2023-08-26 HISTORY — PX: CORONARY STENT INTERVENTION: CATH118234

## 2023-08-26 HISTORY — PX: RIGHT/LEFT HEART CATH AND CORONARY ANGIOGRAPHY: CATH118266

## 2023-08-26 HISTORY — PX: CORONARY PRESSURE/FFR STUDY: CATH118243

## 2023-08-26 LAB — POCT I-STAT EG7
Acid-base deficit: 2 mmol/L (ref 0.0–2.0)
Bicarbonate: 24 mmol/L (ref 20.0–28.0)
Calcium, Ion: 1.2 mmol/L (ref 1.15–1.40)
HCT: 34 % — ABNORMAL LOW (ref 39.0–52.0)
Hemoglobin: 11.6 g/dL — ABNORMAL LOW (ref 13.0–17.0)
O2 Saturation: 62 %
Potassium: 3.7 mmol/L (ref 3.5–5.1)
Sodium: 138 mmol/L (ref 135–145)
TCO2: 25 mmol/L (ref 22–32)
pCO2, Ven: 44.9 mm[Hg] (ref 44–60)
pH, Ven: 7.336 (ref 7.25–7.43)
pO2, Ven: 34 mm[Hg] (ref 32–45)

## 2023-08-26 LAB — POCT I-STAT 7, (LYTES, BLD GAS, ICA,H+H)
Acid-base deficit: 3 mmol/L — ABNORMAL HIGH (ref 0.0–2.0)
Bicarbonate: 22 mmol/L (ref 20.0–28.0)
Calcium, Ion: 1.19 mmol/L (ref 1.15–1.40)
HCT: 34 % — ABNORMAL LOW (ref 39.0–52.0)
Hemoglobin: 11.6 g/dL — ABNORMAL LOW (ref 13.0–17.0)
O2 Saturation: 96 %
Potassium: 3.7 mmol/L (ref 3.5–5.1)
Sodium: 138 mmol/L (ref 135–145)
TCO2: 23 mmol/L (ref 22–32)
pCO2 arterial: 37.4 mm[Hg] (ref 32–48)
pH, Arterial: 7.378 (ref 7.35–7.45)
pO2, Arterial: 85 mm[Hg] (ref 83–108)

## 2023-08-26 LAB — POCT ACTIVATED CLOTTING TIME: Activated Clotting Time: 343 s

## 2023-08-26 SURGERY — RIGHT/LEFT HEART CATH AND CORONARY ANGIOGRAPHY
Anesthesia: Moderate Sedation

## 2023-08-26 MED ORDER — MIDAZOLAM HCL 2 MG/2ML IJ SOLN
INTRAMUSCULAR | Status: AC
  Filled 2023-08-26: qty 2

## 2023-08-26 MED ORDER — SODIUM CHLORIDE 0.9% FLUSH
3.0000 mL | Freq: Two times a day (BID) | INTRAVENOUS | Status: DC
  Administered 2023-08-26 – 2023-08-27 (×2): 3 mL via INTRAVENOUS

## 2023-08-26 MED ORDER — SODIUM CHLORIDE 0.9 % IV SOLN: 250.0000 mL | INTRAVENOUS | Status: DC | PRN

## 2023-08-26 MED ORDER — BIVALIRUDIN BOLUS VIA INFUSION - CUPID
INTRAVENOUS | Status: DC | PRN
  Administered 2023-08-26: 72.975 mg via INTRAVENOUS

## 2023-08-26 MED ORDER — SODIUM CHLORIDE 0.9% FLUSH: 3.0000 mL | INTRAVENOUS | Status: DC | PRN

## 2023-08-26 MED ORDER — LIDOCAINE HCL (PF) 1 % IJ SOLN
INTRAMUSCULAR | Status: DC | PRN
  Administered 2023-08-26: 10 mL

## 2023-08-26 MED ORDER — HYDRALAZINE HCL 20 MG/ML IJ SOLN: 10.0000 mg | INTRAMUSCULAR | Status: AC | PRN

## 2023-08-26 MED ORDER — CLOPIDOGREL BISULFATE 75 MG PO TABS
ORAL_TABLET | ORAL | Status: AC
  Filled 2023-08-26: qty 4

## 2023-08-26 MED ORDER — SODIUM CHLORIDE 0.9 % IV SOLN
INTRAVENOUS | Status: DC | PRN
  Administered 2023-08-26: 1.75 mg/kg/h via INTRAVENOUS

## 2023-08-26 MED ORDER — ASPIRIN 81 MG PO CHEW
CHEWABLE_TABLET | ORAL | Status: AC
Start: 2023-08-26 — End: ?
  Filled 2023-08-26: qty 3

## 2023-08-26 MED ORDER — ACETAMINOPHEN 325 MG PO TABS
650.0000 mg | ORAL_TABLET | ORAL | Status: DC | PRN
  Administered 2023-08-27: 650 mg via ORAL
  Filled 2023-08-26: qty 2

## 2023-08-26 MED ORDER — ROSUVASTATIN CALCIUM 10 MG PO TABS
40.0000 mg | ORAL_TABLET | Freq: Every day | ORAL | Status: DC
  Administered 2023-08-27: 40 mg via ORAL
  Filled 2023-08-26: qty 4

## 2023-08-26 MED ORDER — CLOPIDOGREL BISULFATE 75 MG PO TABS
75.0000 mg | ORAL_TABLET | Freq: Every day | ORAL | Status: DC
  Administered 2023-08-27: 75 mg via ORAL
  Filled 2023-08-26: qty 1

## 2023-08-26 MED ORDER — SODIUM CHLORIDE 0.9 % WEIGHT BASED INFUSION
3.0000 mL/kg/h | INTRAVENOUS | Status: AC
  Administered 2023-08-26: 3 mL/kg/h via INTRAVENOUS

## 2023-08-26 MED ORDER — ASPIRIN 81 MG PO CHEW
CHEWABLE_TABLET | ORAL | Status: AC
  Filled 2023-08-26: qty 1

## 2023-08-26 MED ORDER — IOHEXOL 300 MG/ML  SOLN
INTRAMUSCULAR | Status: DC | PRN
  Administered 2023-08-26: 173 mL

## 2023-08-26 MED ORDER — LOSARTAN POTASSIUM 25 MG PO TABS
25.0000 mg | ORAL_TABLET | Freq: Every day | ORAL | Status: DC
  Administered 2023-08-27: 25 mg via ORAL
  Filled 2023-08-26: qty 1

## 2023-08-26 MED ORDER — HEPARIN (PORCINE) IN NACL 2000-0.9 UNIT/L-% IV SOLN
INTRAVENOUS | Status: DC | PRN
  Administered 2023-08-26: 1000 mL

## 2023-08-26 MED ORDER — CLOPIDOGREL BISULFATE 75 MG PO TABS
ORAL_TABLET | ORAL | Status: DC | PRN
  Administered 2023-08-26: 300 mg via ORAL

## 2023-08-26 MED ORDER — ASPIRIN 81 MG PO CHEW
CHEWABLE_TABLET | ORAL | Status: DC | PRN
  Administered 2023-08-26: 243 mg via ORAL

## 2023-08-26 MED ORDER — FERROUS SULFATE 325 (65 FE) MG PO TABS
325.0000 mg | ORAL_TABLET | Freq: Every day | ORAL | Status: DC
Start: 2023-08-27 — End: 2023-08-27
  Administered 2023-08-27: 325 mg via ORAL
  Filled 2023-08-26: qty 1

## 2023-08-26 MED ORDER — SODIUM CHLORIDE 0.9 % WEIGHT BASED INFUSION: 1.0000 mL/kg/h | INTRAVENOUS | Status: AC

## 2023-08-26 MED ORDER — METOPROLOL SUCCINATE ER 50 MG PO TB24
50.0000 mg | ORAL_TABLET | Freq: Every day | ORAL | Status: DC
  Administered 2023-08-27: 50 mg via ORAL
  Filled 2023-08-26: qty 1

## 2023-08-26 MED ORDER — FLUTICASONE PROPIONATE 50 MCG/ACT NA SUSP: 2.0000 | Freq: Two times a day (BID) | NASAL | Status: DC | PRN

## 2023-08-26 MED ORDER — HYDROCODONE-ACETAMINOPHEN 5-325 MG PO TABS
1.0000 | ORAL_TABLET | Freq: Two times a day (BID) | ORAL | Status: DC
  Administered 2023-08-27: 1 via ORAL
  Filled 2023-08-26: qty 1

## 2023-08-26 MED ORDER — FENTANYL CITRATE (PF) 100 MCG/2ML IJ SOLN
INTRAMUSCULAR | Status: DC | PRN
  Administered 2023-08-26 (×3): 25 ug via INTRAVENOUS

## 2023-08-26 MED ORDER — FENTANYL CITRATE (PF) 100 MCG/2ML IJ SOLN
INTRAMUSCULAR | Status: AC
  Filled 2023-08-26: qty 2

## 2023-08-26 MED ORDER — HEPARIN (PORCINE) IN NACL 1000-0.9 UT/500ML-% IV SOLN
INTRAVENOUS | Status: AC
  Filled 2023-08-26: qty 1000

## 2023-08-26 MED ORDER — BIVALIRUDIN TRIFLUOROACETATE 250 MG IV SOLR
INTRAVENOUS | Status: AC
Start: 2023-08-26 — End: ?
  Filled 2023-08-26: qty 250

## 2023-08-26 MED ORDER — ASPIRIN 81 MG PO CHEW
81.0000 mg | CHEWABLE_TABLET | Freq: Every day | ORAL | Status: DC
  Administered 2023-08-27: 81 mg via ORAL
  Filled 2023-08-26: qty 1

## 2023-08-26 MED ORDER — LABETALOL HCL 5 MG/ML IV SOLN: 10.0000 mg | INTRAVENOUS | Status: AC | PRN

## 2023-08-26 MED ORDER — PANTOPRAZOLE SODIUM 40 MG PO TBEC
40.0000 mg | DELAYED_RELEASE_TABLET | Freq: Every day | ORAL | Status: DC
  Administered 2023-08-27: 40 mg via ORAL
  Filled 2023-08-26: qty 1

## 2023-08-26 MED ORDER — TAMSULOSIN HCL 0.4 MG PO CAPS: 0.4000 mg | ORAL_CAPSULE | Freq: Every day | ORAL | Status: DC

## 2023-08-26 MED ORDER — MIDAZOLAM HCL 2 MG/2ML IJ SOLN
INTRAMUSCULAR | Status: DC | PRN
  Administered 2023-08-26: .5 mg via INTRAVENOUS
  Administered 2023-08-26: 1 mg via INTRAVENOUS
  Administered 2023-08-26: .5 mg via INTRAVENOUS

## 2023-08-26 MED ORDER — BIVALIRUDIN TRIFLUOROACETATE 250 MG IV SOLR
INTRAVENOUS | Status: AC
  Filled 2023-08-26: qty 250

## 2023-08-26 MED ORDER — FUROSEMIDE 20 MG PO TABS
20.0000 mg | ORAL_TABLET | Freq: Every day | ORAL | Status: DC
  Administered 2023-08-27: 20 mg via ORAL
  Filled 2023-08-26: qty 1

## 2023-08-26 MED ORDER — SODIUM CHLORIDE 0.9 % WEIGHT BASED INFUSION
1.0000 mL/kg/h | INTRAVENOUS | Status: DC
Start: 2023-08-26 — End: 2023-08-26

## 2023-08-26 MED ORDER — DONEPEZIL HCL 5 MG PO TABS
10.0000 mg | ORAL_TABLET | Freq: Every day | ORAL | Status: DC
  Filled 2023-08-26: qty 2

## 2023-08-26 MED ORDER — ONDANSETRON HCL 4 MG/2ML IJ SOLN: 4.0000 mg | Freq: Four times a day (QID) | INTRAMUSCULAR | Status: DC | PRN

## 2023-08-26 MED ORDER — ASPIRIN 81 MG PO CHEW
81.0000 mg | CHEWABLE_TABLET | ORAL | Status: AC
  Administered 2023-08-26: 81 mg via ORAL

## 2023-08-26 SURGICAL SUPPLY — 27 items
BALLN TREK RX 2.5X20 (BALLOONS) ×2
BALLOON TREK RX 2.5X20 (BALLOONS) IMPLANT
CATH INFINITI 5FR MULTPACK ANG (CATHETERS) IMPLANT
CATH SWAN GANZ 7F STRAIGHT (CATHETERS) IMPLANT
CATH VISTA GUIDE 6FR JR4 (CATHETERS) ×4
CATH VISTA GUIDE 6FR JR4 ECOPK (CATHETERS) IMPLANT
CATH VISTA GUIDE 6FR XB3.5 (CATHETERS) IMPLANT
DEVICE CLOSURE MYNXGRIP 6/7F (Vascular Products) IMPLANT
DRAPE BRACHIAL (DRAPES) IMPLANT
GUIDEWIRE PRESSURE X 175 (WIRE) IMPLANT
KIT ENCORE 26 ADVANTAGE (KITS) IMPLANT
KIT SYRINGE INJ CVI SPIKEX1 (MISCELLANEOUS) IMPLANT
NDL PERC 18GX7CM (NEEDLE) IMPLANT
NEEDLE PERC 18GX7CM (NEEDLE) ×2
PACK CARDIAC CATH (CUSTOM PROCEDURE TRAY) ×3 IMPLANT
PROTECTION STATION PRESSURIZED (MISCELLANEOUS) ×2
SET ATX-X65L (MISCELLANEOUS) IMPLANT
SHEATH AVANTI 5FR X 11CM (SHEATH) IMPLANT
SHEATH AVANTI 6FR X 11CM (SHEATH) IMPLANT
SHEATH AVANTI 7FRX11 (SHEATH) IMPLANT
SHEATH PINNACLE ST 6F 45CM (SHEATH) IMPLANT
SHEATH RAIN RADIAL 21G 6FR (SHEATH) IMPLANT
STATION PROTECTION PRESSURIZED (MISCELLANEOUS) IMPLANT
STENT ONYX FRONTIER 3.0X30 (Permanent Stent) IMPLANT
TUBING CIL FLEX 10 FLL-RA (TUBING) IMPLANT
WIRE G HI TQ BMW 190 (WIRE) IMPLANT
WIRE GUIDERIGHT .035X150 (WIRE) IMPLANT

## 2023-08-26 NOTE — Progress Notes (Incomplete)
[facility]    SUBJECTIVE: ***   Vitals:   08/26/23 1530 08/26/23 1545 08/26/23 1612 08/26/23 1737  BP: 137/61 135/65 139/73 130/67  Pulse: (!) 57 65 (!) 55 65  Resp: 14 (!) 21 17   Temp:   97.8 F (36.6 C)   TempSrc:   Oral   SpO2: 98% 95% 98% 100%  Weight:      Height:         Intake/Output Summary (Last 24 hours) at 08/26/2023 1849 Last data filed at 08/26/2023 1357 Gross per 24 hour  Intake --  Output 600 ml  Net -600 ml      PHYSICAL EXAM  General: Well developed, well nourished, in no acute distress HEENT:  Normocephalic and atramatic Neck:  No JVD.  Lungs: Clear bilaterally to auscultation and percussion. Heart: HRRR . Normal S1 and S2 without gallops or murmurs.  Abdomen: Bowel sounds are positive, abdomen soft and non-tender  Msk:  Back normal, normal gait. Normal strength and tone for age. Extremities: No clubbing, cyanosis or edema.   Neuro: Alert and oriented X 3. Psych:  Good affect, responds appropriately   LABS: Basic Metabolic Panel: Recent Labs    08/26/23 1233 08/26/23 1234  NA 138 138  K 3.7 3.7   Liver Function Tests: No results for input(s): "AST", "ALT", "ALKPHOS", "BILITOT", "PROT", "ALBUMIN" in the last 72 hours. No results for input(s): "LIPASE", "AMYLASE" in the last 72 hours. CBC: Recent Labs    08/26/23 1233 08/26/23 1234  HGB 11.6* 11.6*  HCT 34.0* 34.0*   Cardiac Enzymes: No results for input(s): "CKTOTAL", "CKMB", "CKMBINDEX", "TROPONINI" in the last 72 hours. BNP: Invalid input(s): "POCBNP" D-Dimer: No results for input(s): "DDIMER" in the last 72 hours. Hemoglobin A1C: No results for input(s): "HGBA1C" in the last 72 hours. Fasting Lipid Panel: No results for input(s): "CHOL", "HDL", "LDLCALC", "TRIG", "CHOLHDL", "LDLDIRECT" in the last 72 hours. Thyroid Function Tests: No results for input(s): "TSH", "T4TOTAL", "T3FREE", "THYROIDAB" in the last 72 hours.  Invalid input(s): "FREET3" Anemia Panel: No results  for input(s): "VITAMINB12", "FOLATE", "FERRITIN", "TIBC", "IRON", "RETICCTPCT" in the last 72 hours.  No results found.   Echo ***  TELEMETRY: ***:  ASSESSMENT AND PLAN:  Principal Problem:   S/P drug eluting coronary stent placement    1. ***   Alwyn Pea, MD, PHD [facility] 08/26/2023 6:49 PM

## 2023-08-26 NOTE — Progress Notes (Signed)
Two hour bedrest complete. Sat patient up 45 degrees.

## 2023-08-26 NOTE — Plan of Care (Signed)
  Problem: Education: Goal: Knowledge of General Education information will improve Description: Including pain rating scale, medication(s)/side effects and non-pharmacologic comfort measures Outcome: Progressing   Problem: Health Behavior/Discharge Planning: Goal: Ability to manage health-related needs will improve Outcome: Progressing   Problem: Clinical Measurements: Goal: Will remain free from infection Outcome: Progressing   Problem: Clinical Measurements: Goal: Respiratory complications will improve Outcome: Progressing   Problem: Clinical Measurements: Goal: Cardiovascular complication will be avoided Outcome: Progressing   Problem: Pain Managment: Goal: General experience of comfort will improve and/or be controlled Outcome: Progressing   Problem: Safety: Goal: Ability to remain free from injury will improve Outcome: Progressing

## 2023-08-27 DIAGNOSIS — I25110 Atherosclerotic heart disease of native coronary artery with unstable angina pectoris: Secondary | ICD-10-CM | POA: Diagnosis not present

## 2023-08-27 DIAGNOSIS — I200 Unstable angina: Secondary | ICD-10-CM | POA: Diagnosis not present

## 2023-08-27 DIAGNOSIS — Z955 Presence of coronary angioplasty implant and graft: Secondary | ICD-10-CM | POA: Diagnosis not present

## 2023-08-27 DIAGNOSIS — R9439 Abnormal result of other cardiovascular function study: Secondary | ICD-10-CM | POA: Diagnosis not present

## 2023-08-27 LAB — BASIC METABOLIC PANEL
Anion gap: 9 (ref 5–15)
BUN: 21 mg/dL (ref 8–23)
CO2: 21 mmol/L — ABNORMAL LOW (ref 22–32)
Calcium: 8.9 mg/dL (ref 8.9–10.3)
Chloride: 105 mmol/L (ref 98–111)
Creatinine, Ser: 0.91 mg/dL (ref 0.61–1.24)
GFR, Estimated: >60 mL/min (ref >60)
Glucose, Bld: 98 mg/dL (ref 70–99)
Potassium: 3.5 mmol/L (ref 3.5–5.1)
Sodium: 135 mmol/L (ref 135–145)

## 2023-08-27 LAB — CBC
HCT: 34.3 % — ABNORMAL LOW (ref 39.0–52.0)
Hemoglobin: 11.7 g/dL — ABNORMAL LOW (ref 13.0–17.0)
MCH: 31.4 pg (ref 26.0–34.0)
MCHC: 34.1 g/dL (ref 30.0–36.0)
MCV: 92 fL (ref 80.0–100.0)
Platelets: 122 10*3/uL — ABNORMAL LOW (ref 150–400)
RBC: 3.73 MIL/uL — ABNORMAL LOW (ref 4.22–5.81)
RDW: 12.5 % (ref 11.5–15.5)
WBC: 4.9 10*3/uL (ref 4.0–10.5)
nRBC: 0 % (ref 0.0–0.2)

## 2023-08-27 MED ORDER — CLOPIDOGREL BISULFATE 75 MG PO TABS: 75.0000 mg | ORAL_TABLET | Freq: Every day | ORAL | 11 refills | Status: DC

## 2023-08-27 MED ORDER — ASPIRIN 81 MG PO CHEW: 81.0000 mg | CHEWABLE_TABLET | Freq: Every day | ORAL | 0 refills | Status: DC

## 2023-08-27 NOTE — Care Management Obs Status (Signed)
 MEDICARE OBSERVATION STATUS NOTIFICATION   Patient Details  Name: [name] MRN: [id number] Date of Birth: 1941/06/19   Medicare Observation Status Notification Given:  Yes    Felix Host 08/27/2023, 1:40 PM

## 2023-08-27 NOTE — TOC CM/SW Note (Signed)
 Transition of Care [facility]) - Inpatient Brief Assessment   Patient Details  Name: [name] MRN: [id number] Date of Birth: Feb 25, 1941  Transition of Care [facility]) CM/SW Contact:    Lauraine JAYSON Carpen, LCSW Phone Number: 08/27/2023, 12:40 PM   Clinical Narrative: Patient has orders to discharge home today. CSW reviewed chart. No TOC needs identified. CSW signing off.  Transition of Care Asessment: Insurance and Status: Insurance coverage has been reviewed Patient has primary care physician: Yes Home environment has been reviewed: Single family home Prior level of function:: Not documented Prior/Current Home Services: No current home services Social Drivers of Health Review: SDOH reviewed no interventions necessary Readmission risk has been reviewed: Yes Transition of care needs: no transition of care needs at this time

## 2023-08-27 NOTE — Discharge Instructions (Signed)
 Continue current medications maintain aspirin  81 mg a day for 1 month continue Plavix  for 6 to 12 months restart Eliquis  tomorrow 5 mg twice a day Follow-up with cardiology 1 to 2 weeks No heavy lifting Remove the bandage from your leg on Friday

## 2023-08-27 NOTE — Discharge Summary (Signed)
 Physician Discharge Summary      Patient ID: [name] MRN: [id number] DOB/AGE: 1940-07-27 83 y.o.  Admit date: 08/26/2023 Discharge date: 08/27/2023  Primary Discharge Diagnosis status post drug-eluting stent unstable angina positive functional study Secondary Discharge Diagnosis angina coronary artery disease shortness of breath  Significant Diagnostic Studies: angiography: Angiography of heart with cardiac cath left ventriculogram subsequent PCI and stent  Consults: None  Hospital Course: Patient was brought in as an outpatient for diagnostic cardiac cath because of unstable anginal symptoms positive imaging study patient was found to have high-grade lesion in the proximal RCA for which she underwent DES stenting without difficulty reducing lesion from 70 down to 0 patient had some in-stent restenosis of the proximal LAD but only about 50% and FFR imaging and measurements found to be negative at 0.96 so no intervention was undertaken patient was placed on Plavix  and to restart Eliquis  in 1 to 2 days aspirin  81 mg a day for 4 weeks patient to follow-up with cardiology in 1 to 2 weeks   Discharge Exam: Blood pressure (!) 105/54, pulse (!) 50, temperature 97.7 F (36.5 C), temperature source Oral, resp. rate 17, height 6' 1 (1.854 m), weight 97.3 kg, SpO2 97%.   General appearance: appears stated age Neck: no adenopathy, no carotid bruit, no JVD, supple, symmetrical, trachea midline, and thyroid  not enlarged, symmetric, no tenderness/mass/nodules Resp: normal percussion bilaterally Chest wall: no tenderness Cardio: regular rate and rhythm, S1, S2 normal, no murmur, click, rub or gallop GI: soft, non-tender; bowel sounds normal; no masses,  no organomegaly Extremities: extremities normal, atraumatic, no cyanosis or edema Pulses: 2+ and symmetric Neurologic: Alert and oriented X 3, normal strength and tone. Normal symmetric reflexes. Normal coordination and gait Labs:   Lab  Results  Component Value Date   WBC 4.9 08/27/2023   HGB 11.7 (L) 08/27/2023   HCT 34.3 (L) 08/27/2023   MCV 92.0 08/27/2023   PLT 122 (L) 08/27/2023    Recent Labs  Lab 08/27/23 0442  NA 135  K 3.5  CL 105  CO2 21*  BUN 21  CREATININE 0.91  CALCIUM  8.9  GLUCOSE 98      Radiology:  EKG: Atrial fibrillation rate of 60 nonspecific ST-T changes  FOLLOW UP PLANS AND APPOINTMENTS Discharge Instructions     AMB Referral to Cardiac Rehabilitation - Phase II   Complete by: As directed    Diagnosis: Coronary Stents   After initial evaluation and assessments completed: Virtual Based Care may be provided alone or in conjunction with Phase 2 Cardiac Rehab based on patient barriers.: Yes      Allergies as of 08/27/2023       Reactions   Tramadol Other (See Comments)   Hallucination        Medication List     TAKE these medications    acetaminophen  650 MG CR tablet Commonly known as: TYLENOL  Take 650-1,300 mg by mouth every 8 (eight) hours as needed for pain.   apixaban  5 MG Tabs tablet Commonly known as: ELIQUIS  Take 1 tablet (5 mg total) by mouth 2 (two) times daily.   aspirin  81 MG chewable tablet Chew 1 tablet (81 mg total) by mouth daily. Start taking on: August 28, 2023   clopidogrel  75 MG tablet Commonly known as: PLAVIX  Take 1 tablet (75 mg total) by mouth daily with breakfast. Start taking on: August 28, 2023   donepezil  10 MG tablet Commonly known as: ARICEPT  Take 10 mg by mouth at  bedtime.   fluticasone  50 MCG/ACT nasal spray Commonly known as: FLONASE  Place 2 sprays into both nostrils 2 (two) times daily as needed for allergies or rhinitis.   furosemide  20 MG tablet Commonly known as: LASIX  Take 20 mg by mouth daily.   HYDROcodone -acetaminophen  5-325 MG tablet Commonly known as: NORCO/VICODIN Take 1 tablet by mouth 2 (two) times daily.   losartan  25 MG tablet Commonly known as: COZAAR  Take 1 tablet (25 mg total) by mouth daily.    metoprolol  succinate 50 MG 24 hr tablet Commonly known as: TOPROL -XL Take 1 tablet (50 mg total) by mouth daily. Take with or immediately following a meal.   pantoprazole  40 MG tablet Commonly known as: PROTONIX  Take 40 mg by mouth daily.   rosuvastatin  40 MG tablet Commonly known as: CRESTOR  Take 1 tablet (40 mg total) by mouth daily. What changed: how much to take   SLOW FE PO Take 1 tablet by mouth 2 (two) times a week.   tamsulosin  0.4 MG Caps capsule Commonly known as: FLOMAX  Take 0.4 mg by mouth at bedtime.   triamcinolone  cream 0.1 % Commonly known as: KENALOG  Apply 1 Application topically daily as needed (itching).        Follow-up Information     Florencio Cara BIRCH, MD. Call in 2 week(s).   Specialties: Cardiology, Internal Medicine Contact information: [address] [place] [address] [phone]                 BRING ALL MEDICATIONS WITH YOU TO FOLLOW UP APPOINTMENTS  Time spent with patient to include physician time: 25 mintes Signed:  Cara BIRCH Florencio MD 08/27/2023, 12:27 PM

## 2023-08-27 NOTE — Progress Notes (Signed)
 Patient discharging home, all DC paperwork reviewed and sent with pt. Patient leaving with all personal belongings with spouse via private vehicle.

## 2023-08-28 ENCOUNTER — Encounter: Payer: Self-pay | Admitting: Internal Medicine

## 2023-08-28 LAB — CARDIAC CATHETERIZATION: Cath EF Quantitative: 55 %

## 2023-08-28 LAB — LIPOPROTEIN A (LPA): Lipoprotein (a): 23.6 nmol/L (ref <75.0)

## 2023-09-02 DIAGNOSIS — I252 Old myocardial infarction: Secondary | ICD-10-CM | POA: Diagnosis not present

## 2023-09-02 DIAGNOSIS — I4891 Unspecified atrial fibrillation: Secondary | ICD-10-CM | POA: Diagnosis not present

## 2023-09-02 DIAGNOSIS — I2510 Atherosclerotic heart disease of native coronary artery without angina pectoris: Secondary | ICD-10-CM | POA: Diagnosis not present

## 2023-09-02 DIAGNOSIS — E782 Mixed hyperlipidemia: Secondary | ICD-10-CM | POA: Diagnosis not present

## 2023-09-02 DIAGNOSIS — R600 Localized edema: Secondary | ICD-10-CM | POA: Diagnosis not present

## 2023-09-02 DIAGNOSIS — Z955 Presence of coronary angioplasty implant and graft: Secondary | ICD-10-CM | POA: Diagnosis not present

## 2023-09-02 DIAGNOSIS — K219 Gastro-esophageal reflux disease without esophagitis: Secondary | ICD-10-CM | POA: Diagnosis not present

## 2023-09-02 DIAGNOSIS — I4892 Unspecified atrial flutter: Secondary | ICD-10-CM | POA: Diagnosis not present

## 2023-09-02 DIAGNOSIS — I480 Paroxysmal atrial fibrillation: Secondary | ICD-10-CM | POA: Diagnosis not present

## 2023-09-02 DIAGNOSIS — R0602 Shortness of breath: Secondary | ICD-10-CM | POA: Diagnosis not present

## 2023-09-02 DIAGNOSIS — I10 Essential (primary) hypertension: Secondary | ICD-10-CM | POA: Diagnosis not present

## 2023-09-04 ENCOUNTER — Ambulatory Visit: Payer: PPO | Admitting: Dermatology

## 2023-09-04 DIAGNOSIS — L578 Other skin changes due to chronic exposure to nonionizing radiation: Secondary | ICD-10-CM | POA: Diagnosis not present

## 2023-09-04 DIAGNOSIS — D229 Melanocytic nevi, unspecified: Secondary | ICD-10-CM | POA: Diagnosis not present

## 2023-09-04 DIAGNOSIS — W908XXA Exposure to other nonionizing radiation, initial encounter: Secondary | ICD-10-CM

## 2023-09-04 DIAGNOSIS — L821 Other seborrheic keratosis: Secondary | ICD-10-CM | POA: Diagnosis not present

## 2023-09-04 DIAGNOSIS — Z5111 Encounter for antineoplastic chemotherapy: Secondary | ICD-10-CM

## 2023-09-04 DIAGNOSIS — L820 Inflamed seborrheic keratosis: Secondary | ICD-10-CM | POA: Diagnosis not present

## 2023-09-04 DIAGNOSIS — D2239 Melanocytic nevi of other parts of face: Secondary | ICD-10-CM | POA: Diagnosis not present

## 2023-09-04 DIAGNOSIS — L570 Actinic keratosis: Secondary | ICD-10-CM | POA: Diagnosis not present

## 2023-09-04 MED ORDER — AMBULATORY NON FORMULARY MEDICATION: 1 refills | Status: DC

## 2023-09-04 NOTE — Progress Notes (Signed)
Follow-Up Visit   Subjective  [name] is a 83 y.o. male who presents for the following: spots on scalp x2-3 weeks, raised. At times can be itchy and sore, no bleeding. Pt also has spots on posterior L shoulder that are itching and irritated.   The patient has spots, moles and lesions to be evaluated, some may be new or changing and the patient may have concern these could be cancer.   The following portions of the chart were reviewed this encounter and updated as appropriate: medications, allergies, medical history  Review of Systems:  No other skin or systemic complaints except as noted in HPI or Assessment and Plan.  Objective  Well appearing patient in no apparent distress; mood and affect are within normal limits.   A focused examination was performed of the following areas: Scalp, back   Relevant exam findings are noted in the Assessment and Plan.  Scalp x5 (5) Pink scaly macules L scapula x 2 (2) Stuck on waxy paps with erythema  Assessment & Plan    SEBORRHEIC KERATOSIS - Stuck-on, waxy, tan-brown papules and/or plaques  - Benign-appearing - Discussed benign etiology and prognosis. - Observe - Call for any changes    MELANOCYTIC NEVI Exam: Tan-brown and/or pink-flesh-colored symmetric macules and papules, including 4mm flesh papule at L paranasal  Treatment Plan: Benign appearing on exam today. Recommend observation. Call clinic for new or changing moles. Recommend daily use of broad spectrum spf 30+ sunscreen to sun-exposed areas.     ACTINIC DAMAGE WITH PRECANCEROUS ACTINIC KERATOSES Counseling for Topical Chemotherapy Management: Patient exhibits: - Severe, confluent actinic changes with pre-cancerous actinic keratoses that is secondary to cumulative UV radiation exposure over time - Condition that is severe; chronic, not at goal. - diffuse scaly erythematous macules and papules with underlying dyspigmentation - Discussed Prescription "Field  Treatment" topical Chemotherapy for Severe, Chronic Confluent Actinic Changes with Pre-Cancerous Actinic Keratoses Field treatment involves treatment of an entire area of skin that has confluent Actinic Changes (Sun/ Ultraviolet light damage) and PreCancerous Actinic Keratoses by method of PhotoDynamic Therapy (PDT) and/or prescription Topical Chemotherapy agents such as 5-fluorouracil, 5-fluorouracil/calcipotriene, and/or imiquimod.  The purpose is to decrease the number of clinically evident and subclinical PreCancerous lesions to prevent progression to development of skin cancer by chemically destroying early precancer changes that may or may not be visible.  It has been shown to reduce the risk of developing skin cancer in the treated area. As a result of treatment, redness, scaling, crusting, and open sores may occur during treatment course. One or more than one of these methods may be used and may have to be used several times to control, suppress and eliminate the PreCancerous changes. Discussed treatment course, expected reaction, and possible side effects. - Recommend daily broad spectrum sunscreen SPF 30+ to sun-exposed areas, reapply every 2 hours as needed.  - Staying in the shade or wearing long sleeves, sun glasses (UVA+UVB protection) and wide brim hats (4-inch brim around the entire circumference of the hat) are also recommended. - Call for new or changing lesions.   - Start 5-fluorouracil/calcipotriene cream twice a day for 7-10 days to affected areas including scalp. Prescription sent to [facility]. Patient advised they will receive an email to purchase the medication online and have it sent to their home. Patient provided with handout reviewing treatment course and side effects and advised to call or message [place] on MyChart with any concerns.  Reviewed course of treatment and  expected reaction.  Patient advised to expect inflammation and crusting and advised that  erosions are possible.  Patient advised to be diligent with sun protection during and after treatment. Counseled to keep medication out of reach of children and pets.   AK (ACTINIC KERATOSIS) (5) Scalp x5 (5) Actinic keratoses are precancerous spots that appear secondary to cumulative UV radiation exposure/sun exposure over time. They are chronic with expected duration over 1 year. A portion of actinic keratoses will progress to squamous cell carcinoma of the skin. It is not possible to reliably predict which spots will progress to skin cancer and so treatment is recommended to prevent development of skin cancer.  Recommend daily broad spectrum sunscreen SPF 30+ to sun-exposed areas, reapply every 2 hours as needed.  Recommend staying in the shade or wearing long sleeves, sun glasses (UVA+UVB protection) and wide brim hats (4-inch brim around the entire circumference of the hat). Call for new or changing lesions. Destruction of lesion - Scalp x5 (5)  Destruction method: cryotherapy   Informed consent: discussed and consent obtained   Lesion destroyed using liquid nitrogen: Yes   Region frozen until ice ball extended beyond lesion: Yes   Outcome: patient tolerated procedure well with no complications   Post-procedure details: wound care instructions given   Additional details:  Prior to procedure, discussed risks of blister formation, small wound, skin dyspigmentation, or rare scar following cryotherapy. Recommend Vaseline ointment to treated areas while healing.  HYPERTROPHIC ACTINIC KERATOSIS vertex x 1 Destruction of lesion - vertex x 1  Destruction method: cryotherapy   Informed consent: discussed and consent obtained   Lesion destroyed using liquid nitrogen: Yes   Region frozen until ice ball extended beyond lesion: Yes   Outcome: patient tolerated procedure well with no complications   Post-procedure details: wound care instructions given   Additional details:  Prior to procedure,  discussed risks of blister formation, small wound, skin dyspigmentation, or rare scar following cryotherapy. Recommend Vaseline ointment to treated areas while healing.  INFLAMED SEBORRHEIC KERATOSIS (2) L scapula x 2 (2) Symptomatic, irritating, patient would like treated. Destruction of lesion - L scapula x 2 (2)  Destruction method: cryotherapy   Informed consent: discussed and consent obtained   Lesion destroyed using liquid nitrogen: Yes   Region frozen until ice ball extended beyond lesion: Yes   Outcome: patient tolerated procedure well with no complications   Post-procedure details: wound care instructions given   Additional details:  Prior to procedure, discussed risks of blister formation, small wound, skin dyspigmentation, or rare scar following cryotherapy. Recommend Vaseline ointment to treated areas while healing.   Return in about 2 months (around 11/02/2023) for AK follow-up on scalp, w/ Dr. Roseanne Reno.  I, Soundra Pilon, CMA, am acting as scribe for Willeen Niece, MD .   Documentation: I have reviewed the above documentation for accuracy and completeness, and I agree with the above.  Willeen Niece, MD

## 2023-09-04 NOTE — Patient Instructions (Addendum)
Apply 5-fluorouracil/calcipotriene cream twice a day for 7-10 days to affected areas including scalp. Prescription sent to [facility]. Patient advised they will receive an email to purchase the medication online and have it sent to their home. Patient provided with handout reviewing treatment course and side effects and advised to call or message [place] on MyChart with any concerns.   Instructions for [facility]  One or more of your medications was sent to the [facility]. You will receive an email from them and can purchase the medicine through that link. It will then be mailed to your home at the address you confirmed. If for any reason you do not receive an email from them, please check your spam folder. If you still do not find the email, please let us know. [facility] phone number is [phone].   5-Fluorouracil/Calcipotriene Patient Education   Actinic keratoses are the dry, red scaly spots on the skin caused by sun damage. A portion of these spots can turn into skin cancer with time, and treating them can help prevent development of skin cancer.   Treatment of these spots requires removal of the defective skin cells. There are various ways to remove actinic keratoses, including freezing with liquid nitrogen, treatment with creams, or treatment with a blue light procedure in the office.   5-fluorouracil cream is a topical cream used to treat actinic keratoses. It works by interfering with the growth of abnormal fast-growing skin cells, such as actinic keratoses. These cells peel off and are replaced by healthy ones. THIS CREAM SHOULD BE KEPT OUT OF REACH OF CHILDREN AND PETS AND SHOULD NOT BE USED BY PREGNANT WOMEN.  5-fluorouracil/calcipotriene is a combination of the 5-fluorouracil cream with a vitamin D analog cream called calcipotriene. The calcipotriene alone does not treat actinic keratoses. However, when it  is combined with 5-fluorouracil, it helps the 5-fluorouracil treat the actinic keratoses much faster so that the same results can be achieved with a much shorter treatment time.  INSTRUCTIONS FOR 5-FLUOROURACIL/CALCIPOTRIENE CREAM:   5-fluorouracil/calcipotriene cream typically only needs to be used for 4-7 days. A thin layer should be applied twice a day to the treatment areas recommended by your physician.   If your physician prescribed you separate tubes of 5-fluourouracil and calcipotriene, apply a thin layer of 5-fluorouracil followed by a thin layer of calcipotriene.   Avoid contact with your eyes or nostrils. Avoid applying the cream to your eyelids or lips unless directed to apply there by your physician. Do not use 5-fluorouracil/calcipotriene cream on infected or open wounds.   You will develop redness, irritation and some crusting at areas where you have pre-cancer damage/actinic keratoses. IF YOU DEVELOP PAIN, BLEEDING, OR SIGNIFICANT CRUSTING, STOP THE TREATMENT EARLY - you have already gotten a good response and the actinic keratoses should clear up well.  Wash your hands after applying 5-fluorouracil 5% cream on your skin.   A moisturizer or sunscreen with a minimum SPF 30 should be applied each morning.   Once you have finished the treatment, you can apply a thin layer of Vaseline twice a day to irritated areas to soothe and calm the areas more quickly. If you experience significant discomfort, contact your physician.  For some patients it is necessary to repeat the treatment for best results.  SIDE EFFECTS: When using 5-fluorouracil/calcipotriene cream, you may have mild irritation, such as redness, dryness, swelling, or a mild burning sensation. This usually resolves within 2 weeks. The  more actinic keratoses you have, the more redness and inflammation you can expect during treatment. Eye irritation has been reported rarely. If this occurs, please let us know.   If you have  any trouble using this cream, please send [place] a MyChart message or call the office. If you have any other questions about this information, please do not hesitate to ask me before you leave the office or contact me on MyChart or by phone.     Cryotherapy Aftercare  Wash gently with soap and water everyday.   Apply Vaseline and Band-Aid daily until healed.    Due to recent changes in healthcare laws, you may see results of your pathology and/or laboratory studies on MyChart before the doctors have had a chance to review them. We understand that in some cases there may be results that are confusing or concerning to you. Please understand that not all results are received at the same time and often the doctors may need to interpret multiple results in order to provide you with the best plan of care or course of treatment. Therefore, we ask that you please give [place] 2 business days to thoroughly review all your results before contacting the office for clarification. Should we see a critical lab result, you will be contacted sooner.   If You Need Anything After Your Visit  If you have any questions or concerns for your doctor, please call our main line at [phone] and press option 4 to reach your doctor's medical assistant. If no one answers, please leave a voicemail as directed and we will return your call as soon as possible. Messages left after 4 pm will be answered the following business day.   You may also send [place] a message via MyChart. We typically respond to MyChart messages within 1-2 business days.  For prescription refills, please ask your pharmacy to contact our office. Our fax number is [phone].  If you have an urgent issue when the clinic is closed that cannot wait until the next business day, you can page your doctor at the number below.    Please note that while we do our best to be available for urgent issues outside of office hours, we are not available 24/7.   If you have  an urgent issue and are unable to reach [place], you may choose to seek medical care at your doctor's office, retail clinic, urgent care center, or emergency room.  If you have a medical emergency, please immediately call 911 or go to the emergency department.  Pager Numbers  - Dr. Gwen Pounds: [phone]  - Dr. Roseanne Reno: [phone]  - Dr. Katrinka Blazing: [phone]   In the event of inclement weather, please call our main line at [phone] for an update on the status of any delays or closures.  Dermatology Medication Tips: Please keep the boxes that topical medications come in in order to help keep track of the instructions about where and how to use these. Pharmacies typically print the medication instructions only on the boxes and not directly on the medication tubes.   If your medication is too expensive, please contact our office at [phone] option 4 or send [place] a message through MyChart.   We are unable to tell what your co-pay for medications will be in advance as this is different depending on your insurance coverage. However, we may be able to find a substitute medication at lower cost or fill out paperwork to get insurance to cover a needed medication.  If a prior authorization is required to get your medication covered by your insurance company, please allow [place] 1-2 business days to complete this process.  Drug prices often vary depending on where the prescription is filled and some pharmacies may offer cheaper prices.  The website www.goodrx.com contains coupons for medications through different pharmacies. The prices here do not account for what the cost may be with help from insurance (it may be cheaper with your insurance), but the website can give you the price if you did not use any insurance.  - You can print the associated coupon and take it with your prescription to the pharmacy.  - You may also stop by our office during regular business hours and pick up a GoodRx coupon  card.  - If you need your prescription sent electronically to a different pharmacy, notify our office through [facility] or by phone at [phone] option 4.     Si Usted Necesita Algo Despus de Su Visita  Tambin puede enviarnos un mensaje a travs de Clinical cytogeneticist. Por lo general respondemos a los mensajes de MyChart en el transcurso de 1 a 2 das hbiles.  Para renovar recetas, por favor pida a su farmacia que se ponga en contacto con nuestra oficina. Annie Sable de fax es [place] [phone].  Si tiene un asunto urgente cuando la clnica est cerrada y que no puede esperar hasta el siguiente da hbil, puede llamar/localizar a su doctor(a) al nmero que aparece a continuacin.   Por favor, tenga en cuenta que aunque hacemos todo lo posible para estar disponibles para asuntos urgentes fuera del horario de [place], no estamos disponibles las 24 horas del da, los 7 [address]  [address] de la [place].   Si tiene un problema urgente y no puede comunicarse con nosotros, puede optar por buscar atencin mdica  en el consultorio de su doctor(a), en una clnica privada, en un centro de atencin urgente o en una sala de emergencias.  Si tiene Engineer, drilling, por favor llame inmediatamente al 911 o vaya a la sala de emergencias.  Nmeros de bper  - Dr. Gwen Pounds: [phone]  - Dra. Roseanne Reno: [phone]  - Dr. Katrinka Blazing: [phone]   En caso de inclemencias del tiempo, por favor llame a Lacy Duverney principal al [phone] para una actualizacin sobre el [place] de cualquier retraso o cierre.  Consejos para la medicacin en dermatologa: Por favor, guarde las cajas en las que vienen los medicamentos de uso tpico para ayudarle a seguir las instrucciones sobre dnde y cmo usarlos. Las farmacias generalmente imprimen las instrucciones del medicamento slo en las cajas y no directamente en los tubos del [place].   Si su medicamento es muy caro, por favor, pngase en contacto con Rolm Gala llamando al [phone] y presione la opcin 4 o envenos un mensaje a travs de Clinical cytogeneticist.   No podemos decirle cul ser su copago por los medicamentos por adelantado ya que esto es diferente dependiendo de la cobertura de su seguro. Sin embargo, es posible que podamos encontrar un medicamento sustituto a Audiological scientist un formulario para que el seguro cubra el medicamento que se considera necesario.   Si se requiere una autorizacin previa para que su compaa de seguros [place] su medicamento, por favor permtanos de 1 a 2 das hbiles para completar [address]  Los precios de los medicamentos varan con frecuencia dependiendo del Environmental consultant de dnde se surte la receta y alguna farmacias pueden ofrecer precios ms baratos.  El sitio web  www.goodrx.com tiene cupones para medicamentos de Health and safety inspector. Los precios aqu no tienen en cuenta lo que podra costar con la ayuda del seguro (puede ser ms barato con su seguro), pero el sitio web puede darle el precio si no utiliz Tourist information centre manager.  - Puede imprimir el cupn correspondiente y llevarlo con su receta a la farmacia.  - Tambin puede pasar por nuestra oficina durante el horario de atencin regular y Education officer, museum una tarjeta de cupones de GoodRx.  - Si necesita que su receta se enve electrnicamente a una farmacia diferente, informe a nuestra oficina a travs de MyChart de [place] o por telfono llamando al [phone] y presione la opcin 4.

## 2023-09-16 DIAGNOSIS — M25552 Pain in left hip: Secondary | ICD-10-CM | POA: Diagnosis not present

## 2023-09-30 DIAGNOSIS — I2510 Atherosclerotic heart disease of native coronary artery without angina pectoris: Secondary | ICD-10-CM | POA: Diagnosis not present

## 2023-09-30 DIAGNOSIS — I4892 Unspecified atrial flutter: Secondary | ICD-10-CM | POA: Diagnosis not present

## 2023-09-30 DIAGNOSIS — I4891 Unspecified atrial fibrillation: Secondary | ICD-10-CM | POA: Diagnosis not present

## 2023-09-30 DIAGNOSIS — E782 Mixed hyperlipidemia: Secondary | ICD-10-CM | POA: Diagnosis not present

## 2023-09-30 DIAGNOSIS — Z01818 Encounter for other preprocedural examination: Secondary | ICD-10-CM | POA: Diagnosis not present

## 2023-09-30 DIAGNOSIS — I10 Essential (primary) hypertension: Secondary | ICD-10-CM | POA: Diagnosis not present

## 2023-09-30 DIAGNOSIS — R7303 Prediabetes: Secondary | ICD-10-CM | POA: Diagnosis not present

## 2023-11-03 ENCOUNTER — Ambulatory Visit: Payer: PPO | Admitting: Dermatology

## 2023-11-13 DIAGNOSIS — M25552 Pain in left hip: Secondary | ICD-10-CM | POA: Diagnosis not present

## 2023-11-17 ENCOUNTER — Ambulatory Visit: Payer: Self-pay | Admitting: Emergency Medicine

## 2023-11-17 DIAGNOSIS — T84091D Other mechanical complication of internal left hip prosthesis, subsequent encounter: Secondary | ICD-10-CM

## 2023-11-17 DIAGNOSIS — G8929 Other chronic pain: Secondary | ICD-10-CM

## 2023-11-17 NOTE — H&P (View-Only) (Signed)
 TOTAL HIP REVISION ADMISSION H&P  Patient is admitted for left revision total hip arthroplasty.  Subjective:  Chief Complaint: left hip pain  HPI: [name], 83 y.o. 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  Onset of symptoms was gradual starting  5-10  years ago with gradually worsening course since that time.  Prior procedures on the left hip include arthroplasty.  Patient currently rates pain in the left hip at 5 out of 10 with activity.  There is night pain, worsening of pain with activity and weight bearing, and pain with passive range of motion.  This condition presents safety issues increasing the risk of falls.   There is no current active infection.  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   Varicose veins of bilateral lower extremities with pain 05/03/2016   Benign essential hypertension 04/01/2016   Past Medical History:  Diagnosis Date   Actinic keratosis    Atrial fibrillation (HCC)    Basal cell carcinoma 05/28/2022   L  lower ear helix, treated with [facility] 06/10/2022   Hypertension    Lumbar disc disease    Reflux esophagitis     Past Surgical History:  Procedure Laterality Date   CORONARY PRESSURE/FFR STUDY N/A 08/26/2023   Procedure: CORONARY PRESSURE/FFR STUDY;  Surgeon: Antonette Batters, MD;  Location: [facility];  Service: Cardiovascular;  Laterality: N/A;   CORONARY STENT INTERVENTION N/A 08/26/2023   Procedure: CORONARY STENT INTERVENTION;  Surgeon: Antonette Batters, MD;  Location: [facility];  Service: Cardiovascular;  Laterality: N/A;   LEFT HEART CATH AND CORONARY ANGIOGRAPHY N/A 04/11/2021   Procedure: LEFT HEART CATH AND CORONARY ANGIOGRAPHY and possible PCI and stent;  Surgeon: Antonette Batters, MD;  Location: [facility];  Service: Cardiovascular;  Laterality: N/A;   RIGHT/LEFT HEART CATH AND CORONARY ANGIOGRAPHY Bilateral 08/26/2023   Procedure: RIGHT/LEFT HEART CATH AND CORONARY ANGIOGRAPHY;  Surgeon: Antonette Batters, MD;  Location: [facility];  Service: Cardiovascular;  Laterality: Bilateral;    Current Outpatient Medications  Medication Sig Dispense Refill Last Dose/Taking   acetaminophen  (TYLENOL ) 650 MG CR tablet Take 650-1,300 mg by mouth every 8 (eight) hours as needed for pain.      AMBULATORY NON FORMULARY MEDICATION BID for 10 days to aa, scalp 30 g 1    apixaban  (ELIQUIS ) 5 MG TABS tablet Take 1 tablet (5 mg total) by mouth 2 (two) times daily. 60 tablet 0    aspirin  81 MG chewable tablet Chew 1 tablet (81 mg total) by mouth daily. 30 tablet 0  clopidogrel  (PLAVIX ) 75 MG tablet Take 1 tablet (75 mg total) by mouth daily with breakfast. 30 tablet 11    donepezil  (ARICEPT ) 10 MG tablet Take 10 mg by mouth at bedtime.      Ferrous Sulfate  (SLOW FE PO) Take 1 tablet by mouth 2 (two) times a week.      fluticasone  (FLONASE ) 50 MCG/ACT nasal spray Place 2 sprays into both nostrils 2 (two) times daily as needed for allergies or rhinitis.       furosemide  (LASIX ) 20 MG tablet Take 20 mg by mouth daily.      HYDROcodone -acetaminophen  (NORCO/VICODIN) 5-325 MG tablet Take 1 tablet by mouth 2 (two) times daily.      losartan  (COZAAR ) 25 MG tablet Take 1 tablet (25 mg total) by mouth daily. 30 tablet 0    metoprolol  succinate (TOPROL -XL) 50 MG 24 hr tablet Take 1 tablet (50 mg total) by mouth daily. Take with or immediately following a meal. 30 tablet 0    pantoprazole  (PROTONIX ) 40 MG tablet Take 40 mg by mouth daily.      rosuvastatin  (CRESTOR ) 40 MG tablet Take 1 tablet (40 mg total) by mouth daily. (Patient taking differently: Take 5 mg by mouth daily.) 30 tablet 0    tamsulosin  (FLOMAX ) 0.4 MG CAPS capsule Take 0.4 mg by mouth at bedtime.      triamcinolone  cream (KENALOG ) 0.1 % Apply 1 Application topically daily as needed (itching).      No current facility-administered medications for this visit.   Allergies  Allergen Reactions   Tramadol Other (See Comments)    Hallucination    Social History   Tobacco Use   Smoking status: Former   Smokeless tobacco: Never  Substance Use Topics   Alcohol use: No    No family history on file.    Review of Systems  Musculoskeletal:  Positive for arthralgias.  All other systems reviewed and are negative.   Objective:  Physical Exam Constitutional:      General: He is not in acute distress.    Appearance: Normal appearance. He is normal weight.     Comments: Chronically ill-appearing  HENT:     Head: Normocephalic and atraumatic.  Eyes:     Extraocular Movements: Extraocular movements intact.     Conjunctiva/sclera: Conjunctivae normal.     Pupils: Pupils are equal, round, and reactive to light.  Cardiovascular:     Rate and Rhythm: Regular rhythm. Bradycardia present.     Pulses: Normal pulses.     Heart sounds: Normal heart sounds.     Comments: Mild bradycardia Pulmonary:     Effort: Pulmonary effort is normal. No respiratory distress.     Breath sounds: Normal breath  sounds.  Abdominal:     General: Bowel sounds are normal. There is no distension.     Palpations: Abdomen is soft.     Tenderness: There is no abdominal tenderness.  Musculoskeletal:        General: Tenderness present.     Cervical back: Normal range of motion and neck supple.     Comments: TTP over groin, lateral aspect, greater trochanter.  Mild IT band tenderness.  No significant swelling.  Well healed incision over lateral hip, otherwise no overlying lesions of area of chief complaint.  Decreased strength and ROM due to elicited pain.  Dorsiflexion and plantarflexion intact.  Mild decreased PT/DP pulse of LLE at baseline, otherwise BLE appear grossly neurovascularly intact.  Gait mildly antalgic.  Right arm is  surgically absent at shoulder joint.   Lymphadenopathy:     Cervical: No cervical adenopathy.  Skin:    General: Skin is warm and dry.     Capillary Refill: Capillary refill takes less than 2 seconds.     Findings: No erythema or rash.  Neurological:     General: No focal deficit present.     Mental Status: He is alert and oriented to person, place, and time.  Psychiatric:        Mood and Affect: Mood normal.        Behavior: Behavior normal.     Vital signs in last 24 hours: @VSRANGES @   Labs: Cobalt and chromium blood levels elevated, indicative of severe metallosis.  Estimated body mass index is 28.31 kg/m as calculated from the following:   Height as of 08/26/23: 6\' 1"  (1.854 m).   Weight as of 08/26/23: 97.3 kg.  Imaging Review:  Plain radiographs demonstrate total hip arthroplasty hardware in place, with severe surrounding heterotopic ossification. The bone quality appears to be  somewhat poor  for age and reported activity level.      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 Revision total hip arthroplasty with goal of liner exchange is deemed medically necessary. The treatment options including medical management, injection therapy, arthroscopy and arthroplasty were discussed at length. The risks and benefits of total hip arthroplasty were presented and reviewed. The risks due to aseptic loosening, infection, stiffness, dislocation/subluxation,  thromboembolic complications and other imponderables were discussed.  The patient acknowledged the explanation, agreed to proceed with the plan and consent was signed. Patient is being admitted for inpatient treatment for surgery, pain control, PT, OT, prophylactic antibiotics, VTE prophylaxis, progressive ambulation and ADL's and discharge planning. The patient is planning to be discharged to skilled nursing facility. [facility]).

## 2023-11-17 NOTE — H&P (Signed)
 TOTAL HIP REVISION ADMISSION H&P  Patient is admitted for left revision total hip arthroplasty.  Subjective:  Chief Complaint: left hip pain  HPI: [name], 83 y.o. 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  Onset of symptoms was gradual starting  5-10  years ago with gradually worsening course since that time.  Prior procedures on the left hip include arthroplasty.  Patient currently rates pain in the left hip at 5 out of 10 with activity.  There is night pain, worsening of pain with activity and weight bearing, and pain with passive range of motion.  This condition presents safety issues increasing the risk of falls.   There is no current active infection.  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   Varicose veins of bilateral lower extremities with pain 05/03/2016   Benign essential hypertension 04/01/2016   Past Medical History:  Diagnosis Date   Actinic keratosis    Atrial fibrillation (HCC)    Basal cell carcinoma 05/28/2022   L  lower ear helix, treated with [facility] 06/10/2022   Hypertension    Lumbar disc disease    Reflux esophagitis     Past Surgical History:  Procedure Laterality Date   CORONARY PRESSURE/FFR STUDY N/A 08/26/2023   Procedure: CORONARY PRESSURE/FFR STUDY;  Surgeon: Antonette Batters, MD;  Location: [facility];  Service: Cardiovascular;  Laterality: N/A;   CORONARY STENT INTERVENTION N/A 08/26/2023   Procedure: CORONARY STENT INTERVENTION;  Surgeon: Antonette Batters, MD;  Location: [facility];  Service: Cardiovascular;  Laterality: N/A;   LEFT HEART CATH AND CORONARY ANGIOGRAPHY N/A 04/11/2021   Procedure: LEFT HEART CATH AND CORONARY ANGIOGRAPHY and possible PCI and stent;  Surgeon: Antonette Batters, MD;  Location: [facility];  Service: Cardiovascular;  Laterality: N/A;   RIGHT/LEFT HEART CATH AND CORONARY ANGIOGRAPHY Bilateral 08/26/2023   Procedure: RIGHT/LEFT HEART CATH AND CORONARY ANGIOGRAPHY;  Surgeon: Antonette Batters, MD;  Location: [facility];  Service: Cardiovascular;  Laterality: Bilateral;    Current Outpatient Medications  Medication Sig Dispense Refill Last Dose/Taking   acetaminophen  (TYLENOL ) 650 MG CR tablet Take 650-1,300 mg by mouth every 8 (eight) hours as needed for pain.      AMBULATORY NON FORMULARY MEDICATION BID for 10 days to aa, scalp 30 g 1    apixaban  (ELIQUIS ) 5 MG TABS tablet Take 1 tablet (5 mg total) by mouth 2 (two) times daily. 60 tablet 0    aspirin  81 MG chewable tablet Chew 1 tablet (81 mg total) by mouth daily. 30 tablet 0  clopidogrel  (PLAVIX ) 75 MG tablet Take 1 tablet (75 mg total) by mouth daily with breakfast. 30 tablet 11    donepezil  (ARICEPT ) 10 MG tablet Take 10 mg by mouth at bedtime.      Ferrous Sulfate  (SLOW FE PO) Take 1 tablet by mouth 2 (two) times a week.      fluticasone  (FLONASE ) 50 MCG/ACT nasal spray Place 2 sprays into both nostrils 2 (two) times daily as needed for allergies or rhinitis.       furosemide  (LASIX ) 20 MG tablet Take 20 mg by mouth daily.      HYDROcodone -acetaminophen  (NORCO/VICODIN) 5-325 MG tablet Take 1 tablet by mouth 2 (two) times daily.      losartan  (COZAAR ) 25 MG tablet Take 1 tablet (25 mg total) by mouth daily. 30 tablet 0    metoprolol  succinate (TOPROL -XL) 50 MG 24 hr tablet Take 1 tablet (50 mg total) by mouth daily. Take with or immediately following a meal. 30 tablet 0    pantoprazole  (PROTONIX ) 40 MG tablet Take 40 mg by mouth daily.      rosuvastatin  (CRESTOR ) 40 MG tablet Take 1 tablet (40 mg total) by mouth daily. (Patient taking differently: Take 5 mg by mouth daily.) 30 tablet 0    tamsulosin  (FLOMAX ) 0.4 MG CAPS capsule Take 0.4 mg by mouth at bedtime.      triamcinolone  cream (KENALOG ) 0.1 % Apply 1 Application topically daily as needed (itching).      No current facility-administered medications for this visit.   Allergies  Allergen Reactions   Tramadol Other (See Comments)    Hallucination    Social History   Tobacco Use   Smoking status: Former   Smokeless tobacco: Never  Substance Use Topics   Alcohol use: No    No family history on file.    Review of Systems  Musculoskeletal:  Positive for arthralgias.  All other systems reviewed and are negative.   Objective:  Physical Exam Constitutional:      General: He is not in acute distress.    Appearance: Normal appearance. He is normal weight.     Comments: Chronically ill-appearing  HENT:     Head: Normocephalic and atraumatic.  Eyes:     Extraocular Movements: Extraocular movements intact.     Conjunctiva/sclera: Conjunctivae normal.     Pupils: Pupils are equal, round, and reactive to light.  Cardiovascular:     Rate and Rhythm: Regular rhythm. Bradycardia present.     Pulses: Normal pulses.     Heart sounds: Normal heart sounds.     Comments: Mild bradycardia Pulmonary:     Effort: Pulmonary effort is normal. No respiratory distress.     Breath sounds: Normal breath  sounds.  Abdominal:     General: Bowel sounds are normal. There is no distension.     Palpations: Abdomen is soft.     Tenderness: There is no abdominal tenderness.  Musculoskeletal:        General: Tenderness present.     Cervical back: Normal range of motion and neck supple.     Comments: TTP over groin, lateral aspect, greater trochanter.  Mild IT band tenderness.  No significant swelling.  Well healed incision over lateral hip, otherwise no overlying lesions of area of chief complaint.  Decreased strength and ROM due to elicited pain.  Dorsiflexion and plantarflexion intact.  Mild decreased PT/DP pulse of LLE at baseline, otherwise BLE appear grossly neurovascularly intact.  Gait mildly antalgic.  Right arm is  surgically absent at shoulder joint.   Lymphadenopathy:     Cervical: No cervical adenopathy.  Skin:    General: Skin is warm and dry.     Capillary Refill: Capillary refill takes less than 2 seconds.     Findings: No erythema or rash.  Neurological:     General: No focal deficit present.     Mental Status: He is alert and oriented to person, place, and time.  Psychiatric:        Mood and Affect: Mood normal.        Behavior: Behavior normal.     Vital signs in last 24 hours: @VSRANGES @   Labs: Cobalt and chromium blood levels elevated, indicative of severe metallosis.  Estimated body mass index is 28.31 kg/m as calculated from the following:   Height as of 08/26/23: 6\' 1"  (1.854 m).   Weight as of 08/26/23: 97.3 kg.  Imaging Review:  Plain radiographs demonstrate total hip arthroplasty hardware in place, with severe surrounding heterotopic ossification. The bone quality appears to be  somewhat poor  for age and reported activity level.      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 Revision total hip arthroplasty with goal of liner exchange is deemed medically necessary. The treatment options including medical management, injection therapy, arthroscopy and arthroplasty were discussed at length. The risks and benefits of total hip arthroplasty were presented and reviewed. The risks due to aseptic loosening, infection, stiffness, dislocation/subluxation,  thromboembolic complications and other imponderables were discussed.  The patient acknowledged the explanation, agreed to proceed with the plan and consent was signed. Patient is being admitted for inpatient treatment for surgery, pain control, PT, OT, prophylactic antibiotics, VTE prophylaxis, progressive ambulation and ADL's and discharge planning. The patient is planning to be discharged to skilled nursing facility. [facility]).

## 2023-11-27 NOTE — Progress Notes (Addendum)
 COVID Vaccine Completed: yes  Date of COVID positive in last 90 days:  PCP - Firman Hughes, MD Cardiologist - Alycia Babcock, MD  Medical clearance by Bruce Caper, PA 09/30/23 in Epic   CT- 08/13/23 Epic Chest x-ray - n/a EKG -09/30/23 [facility] Stress Test - over 5 years ago per pt ECHO - 08/01/23 [facility] Cardiac Cath - 08/26/23 Epic Pacemaker/ICD device last checked: n/a Spinal Cord Stimulator: n/a  Bowel Prep - no  Sleep Study -  CPAP -   Fasting Blood Sugar -  Checks Blood Sugar _____ times a day  Last dose of GLP1 agonist-  N/A GLP1 instructions:  Hold 7 days before surgery    Last dose of SGLT-2 inhibitors-  N/A SGLT-2 instructions:  Hold 3 days before surgery    Blood Thinner Instructions: Plavix , Eliquis , hold 5 days Aspirin  Instructions: Last Dose: 12/04/23 2100  Activity level: Can go up a flight of stairs and perform activities of daily living without stopping and without symptoms of chest pain or shortness of breath.  Anesthesia review: CAD, stent x1, a fib, HTN, varicose veins, amputation RUE, needs cardiac clearance   Patient denies shortness of breath, fever, cough and chest pain at PAT appointment  Patient verbalized understanding of instructions that were given to them at the PAT appointment. Patient was also instructed that they will need to review over the PAT instructions again at home before surgery.

## 2023-11-28 ENCOUNTER — Encounter (HOSPITAL_COMMUNITY): Payer: Self-pay

## 2023-11-28 ENCOUNTER — Encounter (HOSPITAL_COMMUNITY)
Admission: RE | Admit: 2023-11-28 | Discharge: 2023-11-28 | Disposition: A | Discharge status: Home / Self Care | Source: Ambulatory Visit | Attending: Orthopedic Surgery | Admitting: Orthopedic Surgery

## 2023-11-28 ENCOUNTER — Other Ambulatory Visit: Payer: Self-pay

## 2023-11-28 VITALS — BP 141/77 | HR 69 | Temp 97.5°F | Resp 16 | Ht 73.0 in | Wt 214.0 lb

## 2023-11-28 DIAGNOSIS — I4891 Unspecified atrial fibrillation: Secondary | ICD-10-CM | POA: Insufficient documentation

## 2023-11-28 DIAGNOSIS — Z01818 Encounter for other preprocedural examination: Secondary | ICD-10-CM

## 2023-11-28 DIAGNOSIS — M25552 Pain in left hip: Secondary | ICD-10-CM | POA: Diagnosis not present

## 2023-11-28 DIAGNOSIS — Z955 Presence of coronary angioplasty implant and graft: Secondary | ICD-10-CM | POA: Diagnosis not present

## 2023-11-28 DIAGNOSIS — G8929 Other chronic pain: Secondary | ICD-10-CM | POA: Insufficient documentation

## 2023-11-28 DIAGNOSIS — I10 Essential (primary) hypertension: Secondary | ICD-10-CM | POA: Insufficient documentation

## 2023-11-28 DIAGNOSIS — Z87891 Personal history of nicotine dependence: Secondary | ICD-10-CM | POA: Insufficient documentation

## 2023-11-28 DIAGNOSIS — Z96642 Presence of left artificial hip joint: Secondary | ICD-10-CM | POA: Diagnosis not present

## 2023-11-28 DIAGNOSIS — I2510 Atherosclerotic heart disease of native coronary artery without angina pectoris: Secondary | ICD-10-CM | POA: Insufficient documentation

## 2023-11-28 DIAGNOSIS — Z01812 Encounter for preprocedural laboratory examination: Secondary | ICD-10-CM | POA: Diagnosis not present

## 2023-11-28 DIAGNOSIS — T84091D Other mechanical complication of internal left hip prosthesis, subsequent encounter: Secondary | ICD-10-CM

## 2023-11-28 HISTORY — DX: Headache, unspecified: R51.9

## 2023-11-28 HISTORY — DX: Atherosclerotic heart disease of native coronary artery without angina pectoris: I25.10

## 2023-11-28 HISTORY — DX: Gastro-esophageal reflux disease without esophagitis: K21.9

## 2023-11-28 HISTORY — DX: Unspecified osteoarthritis, unspecified site: M19.90

## 2023-11-28 LAB — CBC WITH DIFFERENTIAL/PLATELET
Abs Immature Granulocytes: 0.01 10*3/uL (ref 0.00–0.07)
Basophils Absolute: 0 10*3/uL (ref 0.0–0.1)
Basophils Relative: 1 %
Eosinophils Absolute: 0.2 10*3/uL (ref 0.0–0.5)
Eosinophils Relative: 4 %
HCT: 37 % — ABNORMAL LOW (ref 39.0–52.0)
Hemoglobin: 12.2 g/dL — ABNORMAL LOW (ref 13.0–17.0)
Immature Granulocytes: 0 %
Lymphocytes Relative: 28 %
Lymphs Abs: 1.4 10*3/uL (ref 0.7–4.0)
MCH: 32.5 pg (ref 26.0–34.0)
MCHC: 33 g/dL (ref 30.0–36.0)
MCV: 98.7 fL (ref 80.0–100.0)
Monocytes Absolute: 0.4 10*3/uL (ref 0.1–1.0)
Monocytes Relative: 7 %
Neutro Abs: 3 10*3/uL (ref 1.7–7.7)
Neutrophils Relative %: 60 %
Platelets: 147 10*3/uL — ABNORMAL LOW (ref 150–400)
RBC: 3.75 MIL/uL — ABNORMAL LOW (ref 4.22–5.81)
RDW: 12.6 % (ref 11.5–15.5)
WBC: 5 10*3/uL (ref 4.0–10.5)
nRBC: 0 % (ref 0.0–0.2)

## 2023-11-28 LAB — COMPREHENSIVE METABOLIC PANEL WITH GFR
ALT: 14 U/L (ref 0–44)
AST: 20 U/L (ref 15–41)
Albumin: 4.6 g/dL (ref 3.5–5.0)
Alkaline Phosphatase: 37 U/L — ABNORMAL LOW (ref 38–126)
Anion gap: 10 (ref 5–15)
BUN: 21 mg/dL (ref 8–23)
CO2: 25 mmol/L (ref 22–32)
Calcium: 9.5 mg/dL (ref 8.9–10.3)
Chloride: 101 mmol/L (ref 98–111)
Creatinine, Ser: 0.93 mg/dL (ref 0.61–1.24)
GFR, Estimated: >60 mL/min (ref >60)
Glucose, Bld: 110 mg/dL — ABNORMAL HIGH (ref 70–99)
Potassium: 4.2 mmol/L (ref 3.5–5.1)
Sodium: 136 mmol/L (ref 135–145)
Total Bilirubin: 0.9 mg/dL (ref 0.0–1.2)
Total Protein: 7.1 g/dL (ref 6.5–8.1)

## 2023-11-28 LAB — SURGICAL PCR SCREEN
MRSA, PCR: NEGATIVE
Staphylococcus aureus: NEGATIVE

## 2023-11-28 NOTE — Patient Instructions (Signed)
 SURGICAL WAITING ROOM VISITATION  Patients having surgery or a procedure may have no more than 2 support people in the waiting area - these visitors may rotate.    Children under the age of 80 must have an adult with them who is not the patient.  Due to an increase in RSV and influenza rates and associated hospitalizations, children ages 17 and under may not visit patients in [facility] hospitals.  Visitors with respiratory illnesses are discouraged from visiting and should remain at home.  If the patient needs to stay at the hospital during part of their recovery, the visitor guidelines for inpatient rooms apply. Pre-op nurse will coordinate an appropriate time for 1 support person to accompany patient in pre-op.  This support person may not rotate.    Please refer to the [facility] website for the visitor guidelines for Inpatients (after your surgery is over and you are in a regular room).    Your procedure is scheduled on: 12/10/23   Report to [facility] Main Entrance    Report to admitting at 8:55 AM   Call this number if you have problems the morning of surgery [phone]   Do not eat food :After Midnight.   After Midnight you may have the following liquids until 8:25 AM DAY OF SURGERY  Water Non-Citrus Juices (without pulp, NO RED-Apple, White grape, White cranberry) Black Coffee (NO MILK/CREAM OR CREAMERS, sugar ok)  Clear Tea (NO MILK/CREAM OR CREAMERS, sugar ok) regular and decaf                             Plain Jell-O (NO RED)                                           Fruit ices (not with fruit pulp, NO RED)                                     Popsicles (NO RED)                                                               Sports drinks like Gatorade (NO RED)                The day of surgery:  Drink ONE (1) Pre-Surgery Clear Ensure at 8:25 AM the morning of surgery. Drink in one sitting. Do not sip.  This drink was given to you during your hospital   pre-op appointment visit. Nothing else to drink after completing the  Pre-Surgery Clear Ensure.          If you have questions, please contact your surgeon's office.   FOLLOW BOWEL PREP AND ANY ADDITIONAL PRE OP INSTRUCTIONS YOU RECEIVED FROM YOUR SURGEON'S OFFICE!!!     Oral Hygiene is also important to reduce your risk of infection.                                    Remember - BRUSH  YOUR TEETH THE MORNING OF SURGERY WITH YOUR REGULAR TOOTHPASTE  DENTURES WILL BE REMOVED PRIOR TO SURGERY PLEASE DO NOT APPLY "Poly grip" OR ADHESIVES!!!   Stop all vitamins and herbal supplements 7 days before surgery.   Take these medicines the morning of surgery with A SIP OF WATER: Tylenol , Hydrocodone , Metoprolol , Rosuvastatin                                You may not have any metal on your body including jewelry, and body piercing             Do not wear lotions, powders, cologne, or deodorant              Men may shave face and neck.   Do not bring valuables to the hospital. [facility] IS NOT             RESPONSIBLE   FOR VALUABLES.   Contacts, glasses, dentures or bridgework may not be worn into surgery.   Bring small overnight bag day of surgery.   DO NOT BRING YOUR HOME MEDICATIONS TO THE HOSPITAL. PHARMACY WILL DISPENSE MEDICATIONS LISTED ON YOUR MEDICATION LIST TO YOU DURING YOUR ADMISSION IN THE HOSPITAL!    Special Instructions: Bring a copy of your healthcare power of attorney and living will documents the day of surgery if you haven't scanned them before.              Please read over the following fact sheets you were given: IF YOU HAVE QUESTIONS ABOUT YOUR PRE-OP INSTRUCTIONS PLEASE CALL [name]   If you received a COVID test during your pre-op visit  it is requested that you wear a mask when out in public, stay away from anyone that may not be feeling well and notify your surgeon if you develop symptoms. If you test positive for Covid or have been in contact  with anyone that has tested positive in the last 10 days please notify you surgeon.      Pre-operative 5 CHG Bath Instructions   You can play a key role in reducing the risk of infection after surgery. Your skin needs to be as free of germs as possible. You can reduce the number of germs on your skin by washing with CHG (chlorhexidine gluconate) soap before surgery. CHG is an antiseptic soap that kills germs and continues to kill germs even after washing.   DO NOT use if you have an allergy to chlorhexidine/CHG or antibacterial soaps. If your skin becomes reddened or irritated, stop using the CHG and notify one of our RNs at [phone].   Please shower with the CHG soap starting 4 days before surgery using the following schedule:     Please keep in mind the following:  DO NOT shave, including legs and underarms, starting the day of your first shower.   You may shave your face at any point before/day of surgery.  Place clean sheets on your bed the day you start using CHG soap. Use a clean washcloth (not used since being washed) for each shower. DO NOT sleep with pets once you start using the CHG.   CHG Shower Instructions:  If you choose to wash your hair and private area, wash first with your normal shampoo/soap.  After you use shampoo/soap, rinse your hair and body thoroughly to remove shampoo/soap residue.  Turn the water OFF and apply about 3 tablespoons (45 ml) of  CHG soap to a CLEAN washcloth.  Apply CHG soap ONLY FROM YOUR NECK DOWN TO YOUR TOES (washing for 3-5 minutes)  DO NOT use CHG soap on face, private areas, open wounds, or sores.  Pay special attention to the area where your surgery is being performed.  If you are having back surgery, having someone wash your back for you may be helpful. Wait 2 minutes after CHG soap is applied, then you may rinse off the CHG soap.  Pat dry with a clean towel  Put on clean clothes/pajamas   If you choose to wear lotion, please use ONLY  the CHG-compatible lotions on the back of this paper.     Additional instructions for the day of surgery: DO NOT APPLY any lotions, deodorants, cologne, or perfumes.   Put on clean/comfortable clothes.  Brush your teeth.  Ask your nurse before applying any prescription medications to the skin.      CHG Compatible Lotions   Aveeno Moisturizing lotion  Cetaphil Moisturizing Cream  Cetaphil Moisturizing Lotion  Clairol Herbal Essence Moisturizing Lotion, Dry Skin  Clairol Herbal Essence Moisturizing Lotion, Extra Dry Skin  Clairol Herbal Essence Moisturizing Lotion, Normal Skin  Curel Age Defying Therapeutic Moisturizing Lotion with Alpha Hydroxy  Curel Extreme Care Body Lotion  Curel Soothing Hands Moisturizing Hand Lotion  Curel Therapeutic Moisturizing Cream, Fragrance-Free  Curel Therapeutic Moisturizing Lotion, Fragrance-Free  Curel Therapeutic Moisturizing Lotion, Original Formula  Eucerin Daily Replenishing Lotion  Eucerin Dry Skin Therapy Plus Alpha Hydroxy Crme  Eucerin Dry Skin Therapy Plus Alpha Hydroxy Lotion  Eucerin Original Crme  Eucerin Original Lotion  Eucerin Plus Crme Eucerin Plus Lotion  Eucerin TriLipid Replenishing Lotion  Keri Anti-Bacterial Hand Lotion  Keri Deep Conditioning Original Lotion Dry Skin Formula Softly Scented  Keri Deep Conditioning Original Lotion, Fragrance Free Sensitive Skin Formula  Keri Lotion Fast Absorbing Fragrance Free Sensitive Skin Formula  Keri Lotion Fast Absorbing Softly Scented Dry Skin Formula  Keri Original Lotion  Keri Skin Renewal Lotion Keri Silky Smooth Lotion  Keri Silky Smooth Sensitive Skin Lotion  Nivea Body Creamy Conditioning Oil  Nivea Body Extra Enriched Teacher, adult education Moisturizing Lotion Nivea Crme  Nivea Skin Firming Lotion  NutraDerm 30 Skin Lotion  NutraDerm Skin Lotion  NutraDerm Therapeutic Skin Cream  NutraDerm Therapeutic Skin Lotion  ProShield  Protective Hand Cream  Provon moisturizing lotion  WHAT IS A BLOOD TRANSFUSION? Blood Transfusion Information  A transfusion is the replacement of blood or some of its parts. Blood is made up of multiple cells which provide different functions. Red blood cells carry oxygen and are used for blood loss replacement. White blood cells fight against infection. Platelets control bleeding. Plasma helps clot blood. Other blood products are available for specialized needs, such as hemophilia or other clotting disorders. BEFORE THE TRANSFUSION  Who gives blood for transfusions?  Healthy volunteers who are fully evaluated to make sure their blood is safe. This is blood bank blood. Transfusion therapy is the safest it has ever been in the practice of medicine. Before blood is taken from a donor, a complete history is taken to make sure that person has no history of diseases nor engages in risky social behavior (examples are intravenous drug use or sexual activity with multiple partners). The donor's travel history is screened to minimize risk of transmitting infections, such as malaria. The donated blood is tested for signs of infectious diseases, such as HIV and hepatitis. The blood  is then tested to be sure it is compatible with you in order to minimize the chance of a transfusion reaction. If you or a relative donates blood, this is often done in anticipation of surgery and is not appropriate for emergency situations. It takes many days to process the donated blood. RISKS AND COMPLICATIONS Although transfusion therapy is very safe and saves many lives, the main dangers of transfusion include:  Getting an infectious disease. Developing a transfusion reaction. This is an allergic reaction to something in the blood you were given. Every precaution is taken to prevent this. The decision to have a blood transfusion has been considered carefully by your caregiver before blood is given. Blood is not given unless  the benefits outweigh the risks. AFTER THE TRANSFUSION Right after receiving a blood transfusion, you will usually feel much better and more energetic. This is especially true if your red blood cells have gotten low (anemic). The transfusion raises the level of the red blood cells which carry oxygen, and this usually causes an energy increase. The nurse administering the transfusion will monitor you carefully for complications. HOME CARE INSTRUCTIONS  No special instructions are needed after a transfusion. You may find your energy is better. Speak with your caregiver about any limitations on activity for underlying diseases you may have. SEEK MEDICAL CARE IF:  Your condition is not improving after your transfusion. You develop redness or irritation at the intravenous (IV) site. SEEK IMMEDIATE MEDICAL CARE IF:  Any of the following symptoms occur over the next 12 hours: Shaking chills. You have a temperature by mouth above 102 F (38.9 C), not controlled by medicine. Chest, back, or muscle pain. People around you feel you are not acting correctly or are confused. Shortness of breath or difficulty breathing. Dizziness and fainting. You get a rash or develop hives. You have a decrease in urine output. Your urine turns a dark color or changes to pink, red, or brown. Any of the following symptoms occur over the next 10 days: You have a temperature by mouth above 102 F (38.9 C), not controlled by medicine. Shortness of breath. Weakness after normal activity. The white part of the eye turns yellow (jaundice). You have a decrease in the amount of urine or are urinating less often. Your urine turns a dark color or changes to pink, red, or brown. Document Released: 07/05/2000 Document Revised: 09/30/2011 Document Reviewed: 02/22/2008 Exit[employer] Patient Information 2014 [place], [place].  _______________________________________________________________________  Incentive Spirometer  An  incentive spirometer is a tool that can help keep your lungs clear and active. This tool measures how well you are filling your lungs with each breath. Taking long deep breaths may help reverse or decrease the chance of developing breathing (pulmonary) problems (especially infection) following: A long period of time when you are unable to move or be active. BEFORE THE PROCEDURE  If the spirometer includes an indicator to show your best effort, your nurse or respiratory therapist will set it to a desired goal. If possible, sit up straight or lean slightly forward. Try not to slouch. Hold the incentive spirometer in an upright position. INSTRUCTIONS FOR USE  Sit on the edge of your bed if possible, or sit up as far as you can in bed or on a chair. Hold the incentive spirometer in an upright position. Breathe out normally. Place the mouthpiece in your mouth and seal your lips tightly around it. Breathe in slowly and as deeply as possible, raising the piston or the ball  toward the top of the column. Hold your breath for 3-5 seconds or for as long as possible. Allow the piston or ball to fall to the bottom of the column. Remove the mouthpiece from your mouth and breathe out normally. Rest for a few seconds and repeat Steps 1 through 7 at least 10 times every 1-2 hours when you are awake. Take your time and take a few normal breaths between deep breaths. The spirometer may include an indicator to show your best effort. Use the indicator as a goal to work toward during each repetition. After each set of 10 deep breaths, practice coughing to be sure your lungs are clear. If you have an incision (the cut made at the time of surgery), support your incision when coughing by placing a pillow or rolled up towels firmly against it. Once you are able to get out of bed, walk around indoors and cough well. You may stop using the incentive spirometer when instructed by your caregiver.  RISKS AND COMPLICATIONS Take  your time so you do not get dizzy or light-headed. If you are in pain, you may need to take or ask for pain medication before doing incentive spirometry. It is harder to take a deep breath if you are having pain. AFTER USE Rest and breathe slowly and easily. It can be helpful to keep track of a log of your progress. Your caregiver can provide you with a simple table to help with this. If you are using the spirometer at home, follow these instructions: SEEK MEDICAL CARE IF:  You are having difficultly using the spirometer. You have trouble using the spirometer as often as instructed. Your pain medication is not giving enough relief while using the spirometer. You develop fever of 100.5 F (38.1 C) or higher. SEEK IMMEDIATE MEDICAL CARE IF:  You cough up bloody sputum that had not been present before. You develop fever of 102 F (38.9 C) or greater. You develop worsening pain at or near the incision site. MAKE SURE YOU:  Understand these instructions. Will watch your condition. Will get help right away if you are not doing well or get worse. Document Released: 11/18/2006 Document Revised: 09/30/2011 Document Reviewed: 01/19/2007 [facility] Patient Information 2014 [place], [place].   ________________________________________________________________________

## 2023-12-01 NOTE — Progress Notes (Addendum)
 Anesthesia Chart Review   Case: [id number] Date/Time: 12/10/23 1110   Procedures:      TOTAL HIP REVISION (Left: Hip)     APPLICATION, WOUND VAC (Left)   Anesthesia type: Spinal   Pre-op diagnosis: FAILED LEFT HIP PROSTHESIS   Location: [facility] ROOM 08 / [facility]   Surgeons: Murleen Arms, MD       DISCUSSION:83 y.o. former smoker with h/o HTN, atrial fibrillation, CAD s/p DES 08/2023, failed left hip prosthesis scheduled for above procedure 12/10/23 with Dr. Priscille Brought.   Pt s/p DES in February of this year.    Pt last seen by cardiology 12/04/2023.  Per OV note pt stable and may hold Plavix  and Eliquis  for 5 days prior to surgery.   Per PCP pt is cleared medically for surgery. VS: BP (!) 141/77   Pulse 69   Temp (!) 36.4 C (Oral)   Resp 16   Ht 6\' 1"  (1.854 m)   Wt 97.1 kg   SpO2 96%   BMI 28.23 kg/m   PROVIDERS: Sari Cunning, MD is PCP   Cardiologist - Alycia Babcock, MD   LABS: Labs reviewed: Acceptable for surgery. (all labs ordered are listed, but only abnormal results are displayed)  Labs Reviewed  CBC WITH DIFFERENTIAL/PLATELET - Abnormal; Notable for the following components:      Result Value   RBC 3.75 (*)    Hemoglobin 12.2 (*)    HCT 37.0 (*)    Platelets 147 (*)    All other components within normal limits  COMPREHENSIVE METABOLIC PANEL WITH GFR - Abnormal; Notable for the following components:   Glucose, Bld 110 (*)    Alkaline Phosphatase 37 (*)    All other components within normal limits  SURGICAL PCR SCREEN  TYPE AND SCREEN     IMAGES:   EKG:   CV: Echocardiogram 2D complete: (Care Everywhere) 08/01/2023 CONCLUSION ------------------------------------------------------------------------------- NORMAL LEFT VENTRICULAR SYSTOLIC FUNCTION WITH NO LVH ESTIMATED EF: >55% NORMAL LA PRESSURES WITH NORMAL DIASTOLIC FUNCTION NORMAL RIGHT VENTRICULAR SYSTOLIC FUNCTION VALVULAR REGURGITATION: TRIVIAL AR, MILD MR, MILD PR, MILD TR NO  VALVULAR STENOSIS PHYSICIAN IMPRESSIONS -------------------------------------------------------------------- MILD- MODERATE MR   Echo 04/10/2021 1. Left ventricular ejection fraction, by estimation, is 55 to 60%. The  left ventricle has normal function. The left ventricle has no regional  wall motion abnormalities. The left ventricular internal cavity size was  mildly dilated. There is mild left  ventricular hypertrophy. Left ventricular diastolic parameters were  normal.   2. Right ventricular systolic function is normal. The right ventricular  size is normal.   3. Left atrial size was mildly dilated.   4. Right atrial size was mildly dilated.   5. The mitral valve is grossly normal. Moderate mitral valve  regurgitation.   6. Tricuspid valve regurgitation is mild to moderate.   7. The aortic valve is grossly normal. Aortic valve regurgitation is  mild. Mild aortic valve sclerosis is present, with no evidence of aortic  valve stenosis.  Past Medical History:  Diagnosis Date   Actinic keratosis    Arthritis    Atrial fibrillation (HCC)    Basal cell carcinoma 05/28/2022   L lower ear helix, treated with [facility] 06/10/2022   Coronary artery disease    GERD (gastroesophageal reflux disease)    Headache    Hypertension    Lumbar disc disease    Reflux esophagitis     Past Surgical History:  Procedure Laterality Date   AMPUTATION ARM Right  CATARACT EXTRACTION Bilateral    CHOLECYSTECTOMY     COLONOSCOPY     CORONARY PRESSURE/FFR STUDY N/A 08/26/2023   Procedure: CORONARY PRESSURE/FFR STUDY;  Surgeon: Antonette Batters, MD;  Location: [facility] INVASIVE CV LAB;  Service: Cardiovascular;  Laterality: N/A;   CORONARY STENT INTERVENTION N/A 08/26/2023   Procedure: CORONARY STENT INTERVENTION;  Surgeon: Antonette Batters, MD;  Location: [facility] INVASIVE CV LAB;  Service: Cardiovascular;  Laterality: N/A;   LEFT HEART CATH AND CORONARY ANGIOGRAPHY N/A 04/11/2021   Procedure: LEFT HEART  CATH AND CORONARY ANGIOGRAPHY and possible PCI and stent;  Surgeon: Antonette Batters, MD;  Location: [facility] INVASIVE CV LAB;  Service: Cardiovascular;  Laterality: N/A;   RIGHT/LEFT HEART CATH AND CORONARY ANGIOGRAPHY Bilateral 08/26/2023   Procedure: RIGHT/LEFT HEART CATH AND CORONARY ANGIOGRAPHY;  Surgeon: Antonette Batters, MD;  Location: [facility] INVASIVE CV LAB;  Service: Cardiovascular;  Laterality: Bilateral;   TOTAL HIP ARTHROPLASTY Bilateral     MEDICATIONS:  acetaminophen  (TYLENOL ) 650 MG CR tablet   apixaban  (ELIQUIS ) 5 MG TABS tablet   clopidogrel  (PLAVIX ) 75 MG tablet   donepezil  (ARICEPT ) 10 MG tablet   furosemide  (LASIX ) 20 MG tablet   HYDROcodone -acetaminophen  (NORCO/VICODIN) 5-325 MG tablet   losartan  (COZAAR ) 25 MG tablet   metoprolol  succinate (TOPROL -XL) 50 MG 24 hr tablet   oxymetazoline  (AFRIN) 0.05 % nasal spray   pantoprazole  (PROTONIX ) 40 MG tablet   rosuvastatin  (CRESTOR ) 5 MG tablet   tamsulosin  (FLOMAX ) 0.4 MG CAPS capsule   No current facility-administered medications for this encounter.     Chick Cotton Ward, PA-C [facility] Pre-Surgical Testing [phone]

## 2023-12-02 DIAGNOSIS — E782 Mixed hyperlipidemia: Secondary | ICD-10-CM | POA: Diagnosis not present

## 2023-12-02 DIAGNOSIS — R739 Hyperglycemia, unspecified: Secondary | ICD-10-CM | POA: Diagnosis not present

## 2023-12-02 DIAGNOSIS — Z125 Encounter for screening for malignant neoplasm of prostate: Secondary | ICD-10-CM | POA: Diagnosis not present

## 2023-12-04 DIAGNOSIS — K219 Gastro-esophageal reflux disease without esophagitis: Secondary | ICD-10-CM | POA: Diagnosis not present

## 2023-12-04 DIAGNOSIS — Z955 Presence of coronary angioplasty implant and graft: Secondary | ICD-10-CM | POA: Diagnosis not present

## 2023-12-04 DIAGNOSIS — Z01818 Encounter for other preprocedural examination: Secondary | ICD-10-CM | POA: Diagnosis not present

## 2023-12-04 DIAGNOSIS — R600 Localized edema: Secondary | ICD-10-CM | POA: Diagnosis not present

## 2023-12-04 DIAGNOSIS — E782 Mixed hyperlipidemia: Secondary | ICD-10-CM | POA: Diagnosis not present

## 2023-12-04 DIAGNOSIS — I2510 Atherosclerotic heart disease of native coronary artery without angina pectoris: Secondary | ICD-10-CM | POA: Diagnosis not present

## 2023-12-04 DIAGNOSIS — I4891 Unspecified atrial fibrillation: Secondary | ICD-10-CM | POA: Diagnosis not present

## 2023-12-04 DIAGNOSIS — I10 Essential (primary) hypertension: Secondary | ICD-10-CM | POA: Diagnosis not present

## 2023-12-04 DIAGNOSIS — I252 Old myocardial infarction: Secondary | ICD-10-CM | POA: Diagnosis not present

## 2023-12-04 DIAGNOSIS — I4892 Unspecified atrial flutter: Secondary | ICD-10-CM | POA: Diagnosis not present

## 2023-12-04 DIAGNOSIS — R0602 Shortness of breath: Secondary | ICD-10-CM | POA: Diagnosis not present

## 2023-12-09 DIAGNOSIS — E782 Mixed hyperlipidemia: Secondary | ICD-10-CM | POA: Diagnosis not present

## 2023-12-09 DIAGNOSIS — E538 Deficiency of other specified B group vitamins: Secondary | ICD-10-CM | POA: Diagnosis not present

## 2023-12-09 DIAGNOSIS — R972 Elevated prostate specific antigen [PSA]: Secondary | ICD-10-CM | POA: Diagnosis not present

## 2023-12-09 DIAGNOSIS — Z1331 Encounter for screening for depression: Secondary | ICD-10-CM | POA: Diagnosis not present

## 2023-12-09 DIAGNOSIS — I2510 Atherosclerotic heart disease of native coronary artery without angina pectoris: Secondary | ICD-10-CM | POA: Diagnosis not present

## 2023-12-09 DIAGNOSIS — R739 Hyperglycemia, unspecified: Secondary | ICD-10-CM | POA: Diagnosis not present

## 2023-12-09 DIAGNOSIS — Z0000 Encounter for general adult medical examination without abnormal findings: Secondary | ICD-10-CM | POA: Diagnosis not present

## 2023-12-09 LAB — TYPE AND SCREEN
ABO/RH(D): A NEG
Antibody Screen: NEGATIVE

## 2023-12-09 NOTE — Anesthesia Preprocedure Evaluation (Addendum)
 Anesthesia Evaluation  Patient identified by MRN, date of birth, ID band Patient awake    Reviewed: Allergy & Precautions, H&P , NPO status , Patient's Chart, lab work & pertinent test results  Airway Mallampati: II   Neck ROM: full    Dental   Pulmonary former smoker   breath sounds clear to auscultation       Cardiovascular hypertension, + CAD and + Cardiac Stents  + dysrhythmias Atrial Fibrillation  Rhythm:regular Rate:Normal     Neuro/Psych  Headaches    GI/Hepatic ,GERD  ,,  Endo/Other    Renal/GU      Musculoskeletal  (+) Arthritis ,    Abdominal   Peds  Hematology   Anesthesia Other Findings   Reproductive/Obstetrics                             Anesthesia Physical Anesthesia Plan  ASA: 3  Anesthesia Plan: General   Post-op Pain Management:    Induction: Intravenous  PONV Risk Score and Plan: 2 and Ondansetron , Dexamethasone  and Treatment may vary due to age or medical condition  Airway Management Planned: Oral ETT  Additional Equipment:   Intra-op Plan:   Post-operative Plan: Extubation in OR  Informed Consent: I have reviewed the patients History and Physical, chart, labs and discussed the procedure including the risks, benefits and alternatives for the proposed anesthesia with the patient or authorized representative who has indicated his/her understanding and acceptance.     Dental advisory given  Plan Discussed with: CRNA, Anesthesiologist and Surgeon  Anesthesia Plan Comments: (See PAT note 11/28/2023)       Anesthesia Quick Evaluation

## 2023-12-10 ENCOUNTER — Encounter (HOSPITAL_COMMUNITY)
Admission: RE | Disposition: A | Discharge status: Skilled Nursing Facility | Payer: Self-pay | Source: Ambulatory Visit | Attending: Orthopedic Surgery

## 2023-12-10 ENCOUNTER — Inpatient Hospital Stay (HOSPITAL_COMMUNITY)

## 2023-12-10 ENCOUNTER — Inpatient Hospital Stay (HOSPITAL_COMMUNITY): Admitting: Certified Registered Nurse Anesthetist

## 2023-12-10 ENCOUNTER — Encounter (HOSPITAL_COMMUNITY): Payer: Self-pay | Admitting: Orthopedic Surgery

## 2023-12-10 ENCOUNTER — Inpatient Hospital Stay (HOSPITAL_COMMUNITY)
Admission: RE | Admit: 2023-12-10 | Discharge: 2023-12-12 | DRG: 467 | Disposition: A | Discharge status: Skilled Nursing Facility | Source: Ambulatory Visit | Attending: Orthopedic Surgery | Admitting: Orthopedic Surgery

## 2023-12-10 ENCOUNTER — Inpatient Hospital Stay (HOSPITAL_COMMUNITY): Payer: Self-pay | Admitting: Physician Assistant

## 2023-12-10 ENCOUNTER — Other Ambulatory Visit: Payer: Self-pay

## 2023-12-10 DIAGNOSIS — I509 Heart failure, unspecified: Secondary | ICD-10-CM | POA: Diagnosis not present

## 2023-12-10 DIAGNOSIS — G3184 Mild cognitive impairment, so stated: Secondary | ICD-10-CM | POA: Diagnosis not present

## 2023-12-10 DIAGNOSIS — T84091A Other mechanical complication of internal left hip prosthesis, initial encounter: Secondary | ICD-10-CM

## 2023-12-10 DIAGNOSIS — Z9181 History of falling: Secondary | ICD-10-CM

## 2023-12-10 DIAGNOSIS — T84061D Wear of articular bearing surface of internal prosthetic left hip joint, subsequent encounter: Secondary | ICD-10-CM | POA: Diagnosis not present

## 2023-12-10 DIAGNOSIS — M25552 Pain in left hip: Secondary | ICD-10-CM | POA: Diagnosis present

## 2023-12-10 DIAGNOSIS — R071 Chest pain on breathing: Secondary | ICD-10-CM | POA: Diagnosis not present

## 2023-12-10 DIAGNOSIS — I2510 Atherosclerotic heart disease of native coronary artery without angina pectoris: Secondary | ICD-10-CM | POA: Diagnosis present

## 2023-12-10 DIAGNOSIS — Z741 Need for assistance with personal care: Secondary | ICD-10-CM | POA: Diagnosis not present

## 2023-12-10 DIAGNOSIS — Z885 Allergy status to narcotic agent status: Secondary | ICD-10-CM

## 2023-12-10 DIAGNOSIS — I10 Essential (primary) hypertension: Secondary | ICD-10-CM

## 2023-12-10 DIAGNOSIS — I110 Hypertensive heart disease with heart failure: Secondary | ICD-10-CM | POA: Diagnosis not present

## 2023-12-10 DIAGNOSIS — Z7401 Bed confinement status: Secondary | ICD-10-CM | POA: Diagnosis not present

## 2023-12-10 DIAGNOSIS — D6869 Other thrombophilia: Secondary | ICD-10-CM | POA: Diagnosis not present

## 2023-12-10 DIAGNOSIS — K219 Gastro-esophageal reflux disease without esophagitis: Secondary | ICD-10-CM | POA: Diagnosis present

## 2023-12-10 DIAGNOSIS — M4145 Neuromuscular scoliosis, thoracolumbar region: Secondary | ICD-10-CM | POA: Diagnosis not present

## 2023-12-10 DIAGNOSIS — T84018A Broken internal joint prosthesis, other site, initial encounter: Principal | ICD-10-CM

## 2023-12-10 DIAGNOSIS — Z85828 Personal history of other malignant neoplasm of skin: Secondary | ICD-10-CM

## 2023-12-10 DIAGNOSIS — Z471 Aftercare following joint replacement surgery: Secondary | ICD-10-CM | POA: Diagnosis not present

## 2023-12-10 DIAGNOSIS — M6281 Muscle weakness (generalized): Secondary | ICD-10-CM | POA: Diagnosis not present

## 2023-12-10 DIAGNOSIS — Z955 Presence of coronary angioplasty implant and graft: Secondary | ICD-10-CM | POA: Diagnosis not present

## 2023-12-10 DIAGNOSIS — Y792 Prosthetic and other implants, materials and accessory orthopedic devices associated with adverse incidents: Secondary | ICD-10-CM | POA: Diagnosis present

## 2023-12-10 DIAGNOSIS — I4892 Unspecified atrial flutter: Secondary | ICD-10-CM | POA: Diagnosis not present

## 2023-12-10 DIAGNOSIS — Z7982 Long term (current) use of aspirin: Secondary | ICD-10-CM | POA: Diagnosis not present

## 2023-12-10 DIAGNOSIS — T84019A Broken internal joint prosthesis, unspecified site, initial encounter: Secondary | ICD-10-CM | POA: Insufficient documentation

## 2023-12-10 DIAGNOSIS — T84061A Wear of articular bearing surface of internal prosthetic left hip joint, initial encounter: Secondary | ICD-10-CM | POA: Diagnosis not present

## 2023-12-10 DIAGNOSIS — E782 Mixed hyperlipidemia: Secondary | ICD-10-CM | POA: Diagnosis not present

## 2023-12-10 DIAGNOSIS — Z87891 Personal history of nicotine dependence: Secondary | ICD-10-CM | POA: Diagnosis not present

## 2023-12-10 DIAGNOSIS — Z7901 Long term (current) use of anticoagulants: Secondary | ICD-10-CM

## 2023-12-10 DIAGNOSIS — M25551 Pain in right hip: Secondary | ICD-10-CM | POA: Diagnosis not present

## 2023-12-10 DIAGNOSIS — E538 Deficiency of other specified B group vitamins: Secondary | ICD-10-CM | POA: Diagnosis not present

## 2023-12-10 DIAGNOSIS — Z7902 Long term (current) use of antithrombotics/antiplatelets: Secondary | ICD-10-CM | POA: Diagnosis not present

## 2023-12-10 DIAGNOSIS — R55 Syncope and collapse: Secondary | ICD-10-CM | POA: Diagnosis not present

## 2023-12-10 DIAGNOSIS — I4891 Unspecified atrial fibrillation: Secondary | ICD-10-CM | POA: Diagnosis not present

## 2023-12-10 DIAGNOSIS — Z96642 Presence of left artificial hip joint: Secondary | ICD-10-CM | POA: Diagnosis not present

## 2023-12-10 DIAGNOSIS — Z96643 Presence of artificial hip joint, bilateral: Secondary | ICD-10-CM | POA: Diagnosis not present

## 2023-12-10 DIAGNOSIS — R278 Other lack of coordination: Secondary | ICD-10-CM | POA: Diagnosis not present

## 2023-12-10 DIAGNOSIS — Z79899 Other long term (current) drug therapy: Secondary | ICD-10-CM

## 2023-12-10 DIAGNOSIS — R609 Edema, unspecified: Secondary | ICD-10-CM | POA: Diagnosis not present

## 2023-12-10 DIAGNOSIS — E876 Hypokalemia: Secondary | ICD-10-CM | POA: Diagnosis not present

## 2023-12-10 DIAGNOSIS — Z89221 Acquired absence of right upper limb above elbow: Secondary | ICD-10-CM | POA: Diagnosis not present

## 2023-12-10 DIAGNOSIS — I83813 Varicose veins of bilateral lower extremities with pain: Secondary | ICD-10-CM | POA: Diagnosis not present

## 2023-12-10 HISTORY — PX: TOTAL HIP REVISION: SHX763

## 2023-12-10 HISTORY — PX: APPLICATION OF WOUND VAC: SHX5189

## 2023-12-10 SURGERY — TOTAL HIP REVISION
Anesthesia: General | Site: Hip | Laterality: Left

## 2023-12-10 MED ORDER — ALBUMIN HUMAN 5 % IV SOLN: INTRAVENOUS | Status: DC | PRN

## 2023-12-10 MED ORDER — LACTATED RINGERS IV SOLN: INTRAVENOUS | Status: DC

## 2023-12-10 MED ORDER — CHLORHEXIDINE GLUCONATE 0.12 % MT SOLN
15.0000 mL | Freq: Once | OROMUCOSAL | Status: AC
  Administered 2023-12-10: 15 mL via OROMUCOSAL

## 2023-12-10 MED ORDER — METOPROLOL SUCCINATE ER 50 MG PO TB24
50.0000 mg | ORAL_TABLET | Freq: Every day | ORAL | Status: DC
  Administered 2023-12-11 – 2023-12-12 (×2): 50 mg via ORAL
  Filled 2023-12-10 (×2): qty 1

## 2023-12-10 MED ORDER — SODIUM CHLORIDE 0.9 % IR SOLN
Status: DC | PRN
  Administered 2023-12-10: 500 mL
  Administered 2023-12-10: 3000 mL

## 2023-12-10 MED ORDER — TAMSULOSIN HCL 0.4 MG PO CAPS
0.4000 mg | ORAL_CAPSULE | Freq: Every day | ORAL | Status: DC
  Administered 2023-12-10 – 2023-12-11 (×2): 0.4 mg via ORAL
  Filled 2023-12-10 (×2): qty 1

## 2023-12-10 MED ORDER — SODIUM CHLORIDE (PF) 0.9 % IJ SOLN
INTRAMUSCULAR | Status: AC
  Filled 2023-12-10: qty 50

## 2023-12-10 MED ORDER — METHOCARBAMOL 500 MG PO TABS
500.0000 mg | ORAL_TABLET | Freq: Four times a day (QID) | ORAL | Status: DC | PRN
  Administered 2023-12-11: 500 mg via ORAL
  Filled 2023-12-10 (×2): qty 1

## 2023-12-10 MED ORDER — ONDANSETRON HCL 4 MG PO TABS: 4.0000 mg | ORAL_TABLET | Freq: Four times a day (QID) | ORAL | Status: DC | PRN

## 2023-12-10 MED ORDER — PHENOL 1.4 % MT LIQD: 1.0000 | OROMUCOSAL | Status: DC | PRN

## 2023-12-10 MED ORDER — OXYCODONE HCL 5 MG PO TABS
5.0000 mg | ORAL_TABLET | ORAL | Status: DC | PRN
  Administered 2023-12-11 – 2023-12-12 (×2): 5 mg via ORAL
  Filled 2023-12-10 (×2): qty 1

## 2023-12-10 MED ORDER — CEFADROXIL 500 MG PO CAPS: 500.0000 mg | ORAL_CAPSULE | Freq: Two times a day (BID) | ORAL | Status: DC

## 2023-12-10 MED ORDER — OXYCODONE HCL 5 MG/5ML PO SOLN: 5.0000 mg | Freq: Once | ORAL | Status: DC | PRN

## 2023-12-10 MED ORDER — ROSUVASTATIN CALCIUM 5 MG PO TABS
5.0000 mg | ORAL_TABLET | Freq: Every day | ORAL | Status: DC
  Administered 2023-12-11 – 2023-12-12 (×2): 5 mg via ORAL
  Filled 2023-12-10 (×2): qty 1

## 2023-12-10 MED ORDER — 0.9 % SODIUM CHLORIDE (POUR BTL) OPTIME
TOPICAL | Status: DC | PRN
  Administered 2023-12-10: 1000 mL

## 2023-12-10 MED ORDER — DONEPEZIL HCL 10 MG PO TABS
10.0000 mg | ORAL_TABLET | Freq: Every day | ORAL | Status: DC
  Administered 2023-12-10 – 2023-12-11 (×2): 10 mg via ORAL
  Filled 2023-12-10 (×2): qty 1

## 2023-12-10 MED ORDER — PHENYLEPHRINE 80 MCG/ML (10ML) SYRINGE FOR IV PUSH (FOR BLOOD PRESSURE SUPPORT)
PREFILLED_SYRINGE | INTRAVENOUS | Status: AC
  Filled 2023-12-10: qty 10

## 2023-12-10 MED ORDER — OXYCODONE HCL 5 MG PO TABS
5.0000 mg | ORAL_TABLET | ORAL | 0 refills | Status: DC | PRN
Start: 2023-12-10 — End: 2023-12-12

## 2023-12-10 MED ORDER — CEFAZOLIN SODIUM-DEXTROSE 2-4 GM/100ML-% IV SOLN
2.0000 g | Freq: Three times a day (TID) | INTRAVENOUS | Status: DC
  Administered 2023-12-10 – 2023-12-12 (×6): 2 g via INTRAVENOUS
  Filled 2023-12-10 (×6): qty 100

## 2023-12-10 MED ORDER — FUROSEMIDE 20 MG PO TABS
20.0000 mg | ORAL_TABLET | Freq: Every day | ORAL | Status: DC
  Administered 2023-12-11 – 2023-12-12 (×2): 20 mg via ORAL
  Filled 2023-12-10 (×2): qty 1

## 2023-12-10 MED ORDER — ACETAMINOPHEN 500 MG PO TABS
1000.0000 mg | ORAL_TABLET | Freq: Four times a day (QID) | ORAL | Status: AC
  Administered 2023-12-10 – 2023-12-11 (×4): 1000 mg via ORAL
  Filled 2023-12-10 (×4): qty 2

## 2023-12-10 MED ORDER — GLYCOPYRROLATE 0.2 MG/ML IJ SOLN
INTRAMUSCULAR | Status: DC | PRN
  Administered 2023-12-10: .2 mg via INTRAVENOUS

## 2023-12-10 MED ORDER — ONDANSETRON HCL 4 MG/2ML IJ SOLN: 4.0000 mg | Freq: Four times a day (QID) | INTRAMUSCULAR | Status: DC | PRN

## 2023-12-10 MED ORDER — SODIUM CHLORIDE 0.9 % IV SOLN: INTRAVENOUS | Status: DC

## 2023-12-10 MED ORDER — BUPIVACAINE LIPOSOME 1.3 % IJ SUSP: 10.0000 mL | Freq: Once | INTRAMUSCULAR | Status: DC

## 2023-12-10 MED ORDER — ACETAMINOPHEN 500 MG PO TABS: 1000.0000 mg | ORAL_TABLET | Freq: Three times a day (TID) | ORAL | Status: AC | PRN

## 2023-12-10 MED ORDER — FENTANYL CITRATE PF 50 MCG/ML IJ SOSY
25.0000 ug | PREFILLED_SYRINGE | INTRAMUSCULAR | Status: DC | PRN
  Administered 2023-12-10 (×3): 50 ug via INTRAVENOUS

## 2023-12-10 MED ORDER — PHENYLEPHRINE HCL-NACL 20-0.9 MG/250ML-% IV SOLN
INTRAVENOUS | Status: DC | PRN
  Administered 2023-12-10: 40 ug/min via INTRAVENOUS

## 2023-12-10 MED ORDER — APIXABAN 2.5 MG PO TABS
2.5000 mg | ORAL_TABLET | Freq: Two times a day (BID) | ORAL | Status: DC
  Administered 2023-12-11 – 2023-12-12 (×3): 2.5 mg via ORAL
  Filled 2023-12-10 (×3): qty 1

## 2023-12-10 MED ORDER — ALBUMIN HUMAN 5 % IV SOLN
INTRAVENOUS | Status: AC
  Filled 2023-12-10: qty 250

## 2023-12-10 MED ORDER — ROCURONIUM BROMIDE 100 MG/10ML IV SOLN
INTRAVENOUS | Status: DC | PRN
  Administered 2023-12-10: 50 mg via INTRAVENOUS

## 2023-12-10 MED ORDER — PROPOFOL 10 MG/ML IV BOLUS
INTRAVENOUS | Status: DC | PRN
  Administered 2023-12-10: 130 mg via INTRAVENOUS

## 2023-12-10 MED ORDER — HYDROMORPHONE HCL 1 MG/ML IJ SOLN
0.5000 mg | INTRAMUSCULAR | Status: AC | PRN
  Administered 2023-12-10 (×4): 0.5 mg via INTRAVENOUS

## 2023-12-10 MED ORDER — POVIDONE-IODINE 10 % EX SWAB
2.0000 | Freq: Once | CUTANEOUS | Status: AC
  Administered 2023-12-10: 2 via TOPICAL

## 2023-12-10 MED ORDER — ONDANSETRON HCL 4 MG PO TABS: 4.0000 mg | ORAL_TABLET | Freq: Three times a day (TID) | ORAL | 0 refills | Status: DC | PRN

## 2023-12-10 MED ORDER — FENTANYL CITRATE PF 50 MCG/ML IJ SOSY
PREFILLED_SYRINGE | INTRAMUSCULAR | Status: AC
  Filled 2023-12-10: qty 1

## 2023-12-10 MED ORDER — OXYCODONE HCL 5 MG PO TABS: 5.0000 mg | ORAL_TABLET | Freq: Once | ORAL | Status: DC | PRN

## 2023-12-10 MED ORDER — ACETAMINOPHEN 325 MG PO TABS
325.0000 mg | ORAL_TABLET | Freq: Four times a day (QID) | ORAL | Status: DC | PRN
  Administered 2023-12-12: 650 mg via ORAL
  Filled 2023-12-10: qty 2

## 2023-12-10 MED ORDER — FENTANYL CITRATE (PF) 100 MCG/2ML IJ SOLN
INTRAMUSCULAR | Status: AC
  Filled 2023-12-10: qty 2

## 2023-12-10 MED ORDER — BUPIVACAINE-EPINEPHRINE (PF) 0.25% -1:200000 IJ SOLN
INTRAMUSCULAR | Status: DC | PRN
  Administered 2023-12-10: 30 mL

## 2023-12-10 MED ORDER — BUPIVACAINE-EPINEPHRINE (PF) 0.25% -1:200000 IJ SOLN
INTRAMUSCULAR | Status: AC
  Filled 2023-12-10: qty 30

## 2023-12-10 MED ORDER — ORAL CARE MOUTH RINSE: 15.0000 mL | Freq: Once | OROMUCOSAL | Status: AC

## 2023-12-10 MED ORDER — BUPIVACAINE LIPOSOME 1.3 % IJ SUSP
INTRAMUSCULAR | Status: DC | PRN
  Administered 2023-12-10: 20 mL

## 2023-12-10 MED ORDER — EPHEDRINE 5 MG/ML INJ
INTRAVENOUS | Status: AC
  Filled 2023-12-10: qty 5

## 2023-12-10 MED ORDER — APIXABAN 5 MG PO TABS: 5.0000 mg | ORAL_TABLET | Freq: Two times a day (BID) | ORAL | Status: DC

## 2023-12-10 MED ORDER — PROPOFOL 10 MG/ML IV BOLUS
INTRAVENOUS | Status: AC
  Filled 2023-12-10: qty 20

## 2023-12-10 MED ORDER — KETOROLAC TROMETHAMINE 15 MG/ML IJ SOLN
7.5000 mg | Freq: Four times a day (QID) | INTRAMUSCULAR | Status: DC
  Administered 2023-12-11 (×2): 7.5 mg via INTRAVENOUS
  Filled 2023-12-10 (×2): qty 1

## 2023-12-10 MED ORDER — HYDROMORPHONE HCL 1 MG/ML IJ SOLN: 0.5000 mg | INTRAMUSCULAR | Status: DC | PRN

## 2023-12-10 MED ORDER — POLYETHYLENE GLYCOL 3350 17 G PO PACK
17.0000 g | PACK | Freq: Every day | ORAL | Status: DC | PRN
  Filled 2023-12-10: qty 1

## 2023-12-10 MED ORDER — LOSARTAN POTASSIUM 25 MG PO TABS
25.0000 mg | ORAL_TABLET | Freq: Every day | ORAL | Status: DC
  Administered 2023-12-11 – 2023-12-12 (×2): 25 mg via ORAL
  Filled 2023-12-10 (×2): qty 1

## 2023-12-10 MED ORDER — WATER FOR IRRIGATION, STERILE IR SOLN
Status: DC | PRN
  Administered 2023-12-10: 1000 mL

## 2023-12-10 MED ORDER — METHOCARBAMOL 1000 MG/10ML IJ SOLN
INTRAMUSCULAR | Status: AC
  Filled 2023-12-10: qty 10

## 2023-12-10 MED ORDER — CEFAZOLIN SODIUM-DEXTROSE 2-4 GM/100ML-% IV SOLN
2.0000 g | INTRAVENOUS | Status: AC
  Administered 2023-12-10: 2 g via INTRAVENOUS
  Filled 2023-12-10: qty 100

## 2023-12-10 MED ORDER — TRANEXAMIC ACID-NACL 1000-0.7 MG/100ML-% IV SOLN
1000.0000 mg | INTRAVENOUS | Status: AC
  Administered 2023-12-10: 1000 mg via INTRAVENOUS
  Filled 2023-12-10: qty 100

## 2023-12-10 MED ORDER — DEXAMETHASONE SODIUM PHOSPHATE 10 MG/ML IJ SOLN: 8.0000 mg | Freq: Once | INTRAMUSCULAR | Status: DC

## 2023-12-10 MED ORDER — SENNA 8.6 MG PO TABS
1.0000 | ORAL_TABLET | Freq: Two times a day (BID) | ORAL | Status: DC
  Administered 2023-12-11 – 2023-12-12 (×2): 8.6 mg via ORAL
  Filled 2023-12-10 (×3): qty 1

## 2023-12-10 MED ORDER — METHOCARBAMOL 1000 MG/10ML IJ SOLN
500.0000 mg | Freq: Four times a day (QID) | INTRAMUSCULAR | Status: DC | PRN
  Administered 2023-12-10: 500 mg via INTRAVENOUS

## 2023-12-10 MED ORDER — METHOCARBAMOL 500 MG PO TABS: 500.0000 mg | ORAL_TABLET | Freq: Three times a day (TID) | ORAL | 0 refills | Status: DC | PRN

## 2023-12-10 MED ORDER — PHENYLEPHRINE HCL-NACL 20-0.9 MG/250ML-% IV SOLN
INTRAVENOUS | Status: AC
  Filled 2023-12-10: qty 250

## 2023-12-10 MED ORDER — HYDROMORPHONE HCL 1 MG/ML IJ SOLN
INTRAMUSCULAR | Status: AC
  Filled 2023-12-10: qty 2

## 2023-12-10 MED ORDER — PANTOPRAZOLE SODIUM 40 MG PO TBEC
40.0000 mg | DELAYED_RELEASE_TABLET | Freq: Every day | ORAL | Status: DC
  Administered 2023-12-11 – 2023-12-12 (×2): 40 mg via ORAL
  Filled 2023-12-10 (×2): qty 1

## 2023-12-10 MED ORDER — ACETAMINOPHEN 500 MG PO TABS
1000.0000 mg | ORAL_TABLET | Freq: Once | ORAL | Status: DC
  Filled 2023-12-10: qty 2

## 2023-12-10 MED ORDER — MENTHOL 3 MG MT LOZG: 1.0000 | LOZENGE | OROMUCOSAL | Status: DC | PRN

## 2023-12-10 MED ORDER — EPHEDRINE SULFATE-NACL 50-0.9 MG/10ML-% IV SOSY
PREFILLED_SYRINGE | INTRAVENOUS | Status: DC | PRN
Start: 2023-12-10 — End: 2023-12-10
  Administered 2023-12-10: 10 mg via INTRAVENOUS

## 2023-12-10 MED ORDER — FENTANYL CITRATE PF 50 MCG/ML IJ SOSY
PREFILLED_SYRINGE | INTRAMUSCULAR | Status: AC
  Filled 2023-12-10: qty 2

## 2023-12-10 MED ORDER — LIDOCAINE HCL (CARDIAC) PF 100 MG/5ML IV SOSY
PREFILLED_SYRINGE | INTRAVENOUS | Status: DC | PRN
  Administered 2023-12-10: 60 mg via INTRAVENOUS

## 2023-12-10 MED ORDER — ISOPROPYL ALCOHOL 70 % SOLN
Status: DC | PRN
  Administered 2023-12-10: 1 via TOPICAL

## 2023-12-10 MED ORDER — SUGAMMADEX SODIUM 200 MG/2ML IV SOLN
INTRAVENOUS | Status: DC | PRN
  Administered 2023-12-10: 100 mg via INTRAVENOUS

## 2023-12-10 MED ORDER — ROCURONIUM BROMIDE 10 MG/ML (PF) SYRINGE
PREFILLED_SYRINGE | INTRAVENOUS | Status: AC
  Filled 2023-12-10: qty 10

## 2023-12-10 MED ORDER — APIXABAN 5 MG PO TABS: 5.0000 mg | ORAL_TABLET | Freq: Two times a day (BID) | ORAL | 0 refills | Status: DC

## 2023-12-10 MED ORDER — SODIUM CHLORIDE (PF) 0.9 % IJ SOLN
INTRAMUSCULAR | Status: DC | PRN
  Administered 2023-12-10: 30 mL

## 2023-12-10 MED ORDER — BUPIVACAINE LIPOSOME 1.3 % IJ SUSP
INTRAMUSCULAR | Status: AC
  Filled 2023-12-10: qty 20

## 2023-12-10 MED ORDER — DOCUSATE SODIUM 100 MG PO CAPS
100.0000 mg | ORAL_CAPSULE | Freq: Two times a day (BID) | ORAL | Status: DC
  Administered 2023-12-11 – 2023-12-12 (×2): 100 mg via ORAL
  Filled 2023-12-10 (×3): qty 1

## 2023-12-10 MED ORDER — POLYETHYLENE GLYCOL 3350 17 G PO PACK: 17.0000 g | PACK | Freq: Every day | ORAL | 0 refills | Status: DC

## 2023-12-10 MED ORDER — FENTANYL CITRATE (PF) 100 MCG/2ML IJ SOLN
INTRAMUSCULAR | Status: DC | PRN
  Administered 2023-12-10: 100 ug via INTRAVENOUS
  Administered 2023-12-10 (×2): 50 ug via INTRAVENOUS

## 2023-12-10 MED ORDER — DIPHENHYDRAMINE HCL 12.5 MG/5ML PO ELIX: 12.5000 mg | ORAL_SOLUTION | ORAL | Status: DC | PRN

## 2023-12-10 SURGICAL SUPPLY — 69 items
BAG COUNTER SPONGE SURGICOUNT (BAG) IMPLANT
BAG ZIPLOCK 12X15 (MISCELLANEOUS) ×3 IMPLANT
BLADE SAW SAG 25X90X1.19 (BLADE) IMPLANT
BLADE SAW SGTL 81X20 HD (BLADE) IMPLANT
CANISTER PREVENA PLUS 150 (CANNISTER) IMPLANT
CANISTER WOUND CARE 500ML ATS (WOUND CARE) IMPLANT
CHLORAPREP W/TINT 26 (MISCELLANEOUS) ×6 IMPLANT
CNTNR URN SCR LID CUP LEK RST (MISCELLANEOUS) IMPLANT
COVER SURGICAL LIGHT HANDLE (MISCELLANEOUS) ×3 IMPLANT
DERMABOND ADVANCED .7 DNX12 (GAUZE/BANDAGES/DRESSINGS) IMPLANT
DERMABOND ADVANCED .7 DNX6 (GAUZE/BANDAGES/DRESSINGS) IMPLANT
DRAPE C-ARM 42X120 X-RAY (DRAPES) IMPLANT
DRAPE C-ARMOR (DRAPES) IMPLANT
DRAPE HIP W/POCKET STRL (MISCELLANEOUS) ×3 IMPLANT
DRAPE INCISE IOBAN 66X45 STRL (DRAPES) IMPLANT
DRAPE INCISE IOBAN 85X60 (DRAPES) ×3 IMPLANT
DRAPE POUCH INSTRU U-SHP 10X18 (DRAPES) ×3 IMPLANT
DRAPE SHEET LG 3/4 BI-LAMINATE (DRAPES) ×9 IMPLANT
DRAPE U-SHAPE 47X51 STRL (DRAPES) ×6 IMPLANT
DRSG AQUACEL AG ADV 3.5X10 (GAUZE/BANDAGES/DRESSINGS) IMPLANT
ELECT BLADE TIP CTD 4 INCH (ELECTRODE) ×3 IMPLANT
ELECT NDL TIP 2.8 STRL (NEEDLE) IMPLANT
ELECT NEEDLE TIP 2.8 STRL (NEEDLE) IMPLANT
ELECT PENCIL ROCKER SW 15FT (MISCELLANEOUS) ×3 IMPLANT
ELECT REM PT RETURN 15FT ADLT (MISCELLANEOUS) ×3 IMPLANT
GAUZE SPONGE 4X4 12PLY STRL LF (GAUZE/BANDAGES/DRESSINGS) ×3 IMPLANT
GLOVE BIO SURGEON STRL SZ 6.5 (GLOVE) ×6 IMPLANT
GLOVE BIOGEL PI IND STRL 6.5 (GLOVE) ×3 IMPLANT
GLOVE BIOGEL PI IND STRL 8 (GLOVE) ×3 IMPLANT
GLOVE SURG ORTHO 8.0 STRL STRW (GLOVE) ×6 IMPLANT
GOWN STRL REUS W/ TWL XL LVL3 (GOWN DISPOSABLE) ×6 IMPLANT
HEAD DELTA CERAMIC 32 P9 12/14 (Hips) IMPLANT
HOLDER FOLEY CATH W/STRAP (MISCELLANEOUS) ×3 IMPLANT
HOOD PEEL AWAY T7 (MISCELLANEOUS) ×9 IMPLANT
KIT BASIN OR (CUSTOM PROCEDURE TRAY) ×3 IMPLANT
KIT PREVENA INCISION MGT20CM45 (CANNISTER) IMPLANT
KIT TURNOVER KIT A (KITS) IMPLANT
LINER CONSTRAINED 32X58 (Liner) IMPLANT
MANIFOLD NEPTUNE II (INSTRUMENTS) ×3 IMPLANT
MARKER SKIN DUAL TIP RULER LAB (MISCELLANEOUS) ×3 IMPLANT
NDL SAFETY ECLIPSE 18X1.5 (NEEDLE) ×3 IMPLANT
NS IRRIG 1000ML POUR BTL (IV SOLUTION) ×3 IMPLANT
PACK TOTAL JOINT (CUSTOM PROCEDURE TRAY) ×3 IMPLANT
PAD ARMBOARD POSITIONER FOAM (MISCELLANEOUS) ×3 IMPLANT
PROTECTOR NERVE ULNAR (MISCELLANEOUS) ×3 IMPLANT
RETRIEVER SUT HEWSON (MISCELLANEOUS) ×3 IMPLANT
RING LOCK ACET OD 58/70 (Hips) IMPLANT
SEALER BIPOLAR AQUA 6.0 (INSTRUMENTS) IMPLANT
SET HNDPC FAN SPRY TIP SCT (DISPOSABLE) ×3 IMPLANT
SOLUTION PRONTOSAN WOUND 350ML (IRRIGATION / IRRIGATOR) IMPLANT
SPIKE FLUID TRANSFER (MISCELLANEOUS) ×9 IMPLANT
SUCTION TUBE FRAZIER 12FR DISP (SUCTIONS) ×3 IMPLANT
SUT BONE WAX W31G (SUTURE) IMPLANT
SUT ETHIBOND #5 BRAIDED 30INL (SUTURE) ×3 IMPLANT
SUT ETHILON 3 0 PS 1 (SUTURE) IMPLANT
SUT MNCRL AB 3-0 PS2 18 (SUTURE) ×3 IMPLANT
SUT STRATAFIX 14 PDO 48 VLT (SUTURE) ×3 IMPLANT
SUT STRATAFIX PDO 1 14 VIOLET (SUTURE) ×2 IMPLANT
SUT VIC AB 0 CT1 36 (SUTURE) ×3 IMPLANT
SUT VIC AB 2-0 CT2 27 (SUTURE) ×6 IMPLANT
SUTURE STRATFX 0 PDS 27 VIOLET (SUTURE) ×3 IMPLANT
SYR 30ML LL (SYRINGE) ×6 IMPLANT
SYR 50ML LL SCALE MARK (SYRINGE) ×3 IMPLANT
TOWEL GREEN STERILE FF (TOWEL DISPOSABLE) ×3 IMPLANT
TOWEL OR 17X26 10 PK STRL BLUE (TOWEL DISPOSABLE) ×3 IMPLANT
TRAY FOLEY MTR SLVR 16FR STAT (SET/KITS/TRAYS/PACK) ×3 IMPLANT
TUBE SUCTION HIGH CAP CLEAR NV (SUCTIONS) ×3 IMPLANT
UNDERPAD 30X36 HEAVY ABSORB (UNDERPADS AND DIAPERS) ×3 IMPLANT
WATER STERILE IRR 1000ML POUR (IV SOLUTION) ×6 IMPLANT

## 2023-12-10 NOTE — Anesthesia Postprocedure Evaluation (Signed)
 Anesthesia Post Note  Patient: [name]  Procedure(s) Performed: PARTIAL HIP REVISION (Left: Hip) APPLICATION, WOUND VAC (Left)     Patient location during evaluation: PACU Anesthesia Type: General Level of consciousness: awake Pain management: pain level controlled Vital Signs Assessment: post-procedure vital signs reviewed and stable Respiratory status: spontaneous breathing, nonlabored ventilation and respiratory function stable Cardiovascular status: blood pressure returned to baseline and stable Postop Assessment: no apparent nausea or vomiting Anesthetic complications: no   No notable events documented.  Last Vitals:  Vitals:   12/10/23 1619 12/10/23 1630  BP: 131/86 (!) 141/79  Pulse: 72 68  Resp: 15 11  Temp:    SpO2: 100% 100%    Last Pain:  Vitals:   12/10/23 1650  TempSrc:   PainSc: 8                  Conard Decent

## 2023-12-10 NOTE — Op Note (Addendum)
 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 male who had undergone left total hip arthroplasty in [place] in 1998.  Postoperative course was complicated by early instability ultimately underwent a revision which resolved the instability issues but he developed significant hair loss or topic ossification and stiffness around the left hip.  He came in with progressively worsening left hip and groin pain.  Workup for infection and corrosion demonstrated markedly elevated cobalt levels of 23.5 and chromium levels of 3.9 mcg/L.  Normal inflammatory markers.  Given the markedly elevated levels felt he would benefit from revision of his hip arthroplasty liner and head ball due to the potential toxicity risks of the elevated cobalt and chromium.  The risks benefits and alternatives were discussed with the patient including but not limited to the risks of nonoperative treatment, versus surgical intervention including infection, bleeding, nerve injury, periprosthetic fracture, the need for revision surgery, dislocation, leg length discrepancy, blood clots, cardiopulmonary complications, morbidity, mortality, among others, and they were willing to proceed.     OPERATIVE REPORT     SURGEON:  Priscille Brought, MD    ASSISTANT: Faylene Hoots, PA-C, (Present throughout the entire procedure,  necessary for completion of procedure in a timely manner, assisting with retraction, instrumentation, and closure)     ANESTHESIA: General  ESTIMATED BLOOD LOSS: 700cc    COMPLICATIONS:  None.     UNIQUE ASPECTS OF THE CASE: Very difficult exposure due to encasing  heterotopic ossification superior and anterior along the hip  COMPONENTS:   Duraloc 58 mm constrained liner with 32 mm inner diameter, ceramic titanium sleeve femoral head revision 32 mm +9 mm head ball 12/14 taper Implant Name Type Inv. Item Serial No. Manufacturer Lot No. LRB No. Used Action  RING LOCK ACET OD 58/70 - [id number] Hips RING LOCK ACET OD 58/70  [employer] [id number] Left 1 Implanted  Constricted Liner    [employer]  Left 1 Implanted  HEAD DELTA CERAMIC 32 P9 12/14 - [id number] Hips HEAD DELTA CERAMIC 32 P9 12/14  [employer] [id number] Left 1 Implanted    The aquamantis was utilized for this case to help facilitate better hemostasis as patient was felt to be at increased risk of bleeding because of complex case requiring increased OR time and/or exposure.       PROCEDURE IN DETAIL:   The patient was met in the holding area and  identified.  The appropriate hip was identified and marked at the operative site.  The patient was then transported to the OR  and  placed under anesthesia.  At that point, the patient was  placed in the lateral decubitus position with the operative side up and  secured to the operating room table  and all bony prominences padded. A subaxillary role was also placed.    The operative lower extremity was prepped from the iliac crest to the distal leg.  Sterile draping was performed.  Preoperative antibiotics, 2 gm of ancef,1 gm of Tranexamic Acid , and 8 mg of Decadron  administered. Time out was performed prior to incision.      A routine posterolateral approach was utilized via sharp dissection  carried down to the subcutaneous  tissue utilizing the patient's old incision.  Gross bleeders were Bovie coagulated.  The iliotibial band was identified and incised along the length of the skin incision through the glute max fascia.  Charnley retractor was placed with care to protect the sciatic nerve posteriorly.  Notably there was significant  necrosis of the hip abductors which appeared mostly torn.  Dead abductor tissue was resected.  With the hip internally rotated,  A capsulotomy was then performed off the femoral insertion and also tagged with a #5 Ethibond.  3 synovial soft tissue specimens were sent for aerobic anaerobic culture. Significant superior and anterior had about ossification nose blocking exposure to the hip joint and preventing dislocation.  Using an osteotome we carefully resected as much of the heterotopic bone as we could safely reach. At this point we were able to dislocate the hip and disimpact the femoral head.  There was significant black corrosion on the inner side of the trunnion.  The trunnion itself did not appear severely damaged.  I then exposed the deep acetabulum, visualizing the old acetabular liner.  Using an osteotome we were able to remove the acetabular liner.  The locking ring was also able to be removed. The cup itself appeared to have appropriate inclination and anteversion.  And was not grossly loose or mobile.  At this point given the severe abductor necrosis elected to revise him to a constrained liner.  And elected to use with the longest ceramic head ball with titanium sleeve which was a 32+81mm.  The new locking ring was secured into place.  The constrained acetabular liner was impacted without issue.  We then impacted the +9 ceramic head ball titanium sleeve onto a clean dry trunnion.  This was then reduced.  The locking ring had been placed over the neck.  This was impacted into place.  The hip was taken through a range of motion and felt to not be appropriately seated.  A flatplate x-ray confirmed that the head ball was not seated in the acetabular component.  The locking ring itself at this point was well seated and could not be removed.  Elected to remove the head ball from the trunnion and using a tamp we were able to impacted into the constrained liner first to make sure it was completely seated.   We then reduced the femoral stem into the head ball.  We then used a footed bone tap which was placed into the removal hole in the shoulder over the implant and able to impact the head ball onto the trunnion.  The hip was able to be taken through a range of motion and did not dislocate.  The posterior capsule and remaining abductor tissue was then closed with #5 Ethibond.   I then irrigated the hip copiously with Prontosan irrigation and with 3L normal saline pulse lavage. Periarticular injection was then performed with Exparel.   We repaired the fascia #1 barbed suture, followed by 0 barbed suture for the subcutaneous fat.  Skin was closed with 2-0 Vicryl and 3-0 nylon.  Prevena incisional wound VAC was applied. The patient was then awakened and returned to PACU in stable and satisfactory condition.  Leg lengths in the supine position were assessed and felt to be clinically equal. There were no complications.  Post op recs: WB: WBAT LLE, posterior hip precautions Abx: ancef in-house x 3 days discharged on cefadroxil, follow up intra-op cxs Imaging: PACU pelvis Xray Dressing: Prevena wound vac, keep intact until follow up HO prophylaxis:  Toradol and celebrex DVT prophylaxis: Resume Eliquis  2.5 mg postop day 1 and 2 and then resume Eliquis  5 mg postop day 3.  Resume Plavix  75 mg daily starting postop day 2 Follow up: 2 weeks after surgery for a wound check with Dr. Pryor Browning at [facility].  Address: [address], [place], [place] [address]  Office Phone: [phone]   Priscille Brought, MD Orthopedic Surgeon

## 2023-12-10 NOTE — Discharge Instructions (Addendum)
 INSTRUCTIONS AFTER JOINT REPLACEMENT   Remove items at home which could result in a fall. This includes throw rugs or furniture in walking pathways ICE to the affected joint every three hours while awake for 30 minutes at a time, for at least the first 3-5 days, and then as needed for pain and swelling.  Continue to use ice for pain and swelling. You may notice swelling that will progress down to the foot and ankle.  This is normal after surgery.  Elevate your leg when you are not up walking on it.   Continue to use the breathing machine you got in the hospital (incentive spirometer) which will help keep your temperature down.  It is common for your temperature to cycle up and down following surgery, especially at night when you are not up moving around and exerting yourself.  The breathing machine keeps your lungs expanded and your temperature down.  DIET:  As you were doing prior to hospitalization, we recommend a well-balanced diet.  DRESSING / WOUND CARE / SHOWERING:  Keep the surgical dressing until follow up.  This has battery powered suction to be kept at 125 mmHg and lasts 7 days.  If the battery stops sooner than 7 days, then call our office for further instructions.    The dressing is also water resistant, however it is best to shower with an extra covering, such as saran wrap to keep it dry.  IF THE DRESSING FALLS OFF or the wound gets wet inside, change the dressing with sterile gauze and call our office for further instruction.  Please use good hand washing techniques before changing the dressing.  Do not use any lotions or creams on the incision until instructed by your surgeon.     ACTIVITY  Increase activity slowly as tolerated, but follow the weight bearing instructions below.   No driving for 6 weeks or until further direction given by your physician.  You cannot drive while taking narcotics.  No lifting or carrying greater than 10 lbs. until further directed by your surgeon. Avoid  periods of inactivity such as sitting longer than an hour when not asleep. This helps prevent blood clots.  You may return to work once you are authorized by your doctor.   WEIGHT BEARING: Weight bearing as tolerated with assist device (walker, cane, etc) as directed, use it as long as suggested by your surgeon or therapist, typically at least 4-6 weeks.  EXERCISES  Results after joint replacement surgery are often greatly improved when you follow the exercise, range of motion and muscle strengthening exercises prescribed by your doctor. Safety measures are also important to protect the joint from further injury. Any time any of these exercises cause you to have increased pain or swelling, decrease what you are doing until you are comfortable again and then slowly increase them. If you have problems or questions, call your caregiver or physical therapist for advice.   Rehabilitation is important following a joint replacement. After just a few days of immobilization, the muscles of the leg can become weakened and shrink (atrophy).  These exercises are designed to build up the tone and strength of the thigh and leg muscles and to improve motion. Often times heat used for twenty to thirty minutes before working out will loosen up your tissues and help with improving the range of motion but do not use heat for the first two weeks following surgery (sometimes heat can increase post-operative swelling).   These exercises can be done  on a training (exercise) mat, on the floor, on a table or on a bed. Use whatever works the best and is most comfortable for you.    Use music or television while you are exercising so that the exercises are a pleasant break in your day. This will make your life better with the exercises acting as a break in your routine that you can look forward to.   Perform all exercises about fifteen times, three times per day or as directed.  You should exercise both the operative leg and the  other leg as well.  Exercises include:   Quad Sets - Tighten up the muscle on the front of the thigh (Quad) and hold for 5-10 seconds.   Straight Leg Raises - With your knee straight (if you were given a brace, keep it on), lift the leg to 60 degrees, hold for 3 seconds, and slowly lower the leg.  Perform this exercise against resistance later as your leg gets stronger.  Leg Slides: Lying on your back, slowly slide your foot toward your buttocks, bending your knee up off the floor (only go as far as is comfortable). Then slowly slide your foot back down until your leg is flat on the floor again.  Angel Wings: Lying on your back spread your legs to the side as far apart as you can without causing discomfort.  Hamstring Strength:  Lying on your back, push your heel against the floor with your leg straight by tightening up the muscles of your buttocks.  Repeat, but this time bend your knee to a comfortable angle, and push your heel against the floor.  You may put a pillow under the heel to make it more comfortable if necessary.   A rehabilitation program following joint replacement surgery can speed recovery and prevent re-injury in the future due to weakened muscles. Contact your doctor or a physical therapist for more information on knee rehabilitation.   CONSTIPATION:  Constipation is defined medically as fewer than three stools per week and severe constipation as less than one stool per week.  Even if you have a regular bowel pattern at home, your normal regimen is likely to be disrupted due to multiple reasons following surgery.  Combination of anesthesia, postoperative narcotics, change in appetite and fluid intake all can affect your bowels.   YOU MUST use at least one of the following options; they are listed in order of increasing strength to get the job done.  They are all available over the counter, and you may need to use some, POSSIBLY even all of these options:    Drink plenty of fluids  (prune juice may be helpful) and high fiber foods Colace 100 mg by mouth twice a day  Senokot for constipation as directed and as needed Dulcolax (bisacodyl), take with full glass of water  Miralax  (polyethylene glycol) once or twice a day as needed.  If you have tried all these things and are unable to have a bowel movement in the first 3-4 days after surgery call either your surgeon or your primary doctor.    If you experience loose stools or diarrhea, hold the medications until you stool forms back up.  If your symptoms do not get better within 1 week or if they get worse, check with your doctor.  If you experience "the worst abdominal pain ever" or develop nausea or vomiting, please contact the office immediately for further recommendations for treatment.  ITCHING:  If you experience itching with  your medications, try taking only a single pain pill, or even half a pain pill at a time.  You can also use Benadryl over the counter for itching or also to help with sleep.   TED HOSE STOCKINGS:  Use stockings on both legs until for at least 2 weeks or as directed by physician office. They may be removed at night for sleeping.  MEDICATIONS:  See your medication summary on the "After Visit Summary" that nursing will review with you.  You may have some home medications which will be placed on hold until you complete the course of blood thinner medication.  It is important for you to complete the blood thinner medication as prescribed.  Blood clot prevention (DVT Prophylaxis): After surgery you are at an increased risk for a blood clot.  You are to resume your Eliquis  after surgery to help reduce your risk of getting a blood clot.  For the first two days after surgery, take a half a dose (2.5 mg Eliquis ) twice a day.  Then on the third day, resume your full dose (5 mg Eliquis ) twice a day.  Take your Eliquis  for a minimum of 4 weeks from a post-operative standpoint.  Then follow the direction of your  regular prescribing provider.   Signs of a pulmonary embolus (blood clot in the lungs) include sudden short of breath, feeling lightheaded or dizzy, chest pain with a deep breath, rapid pulse rapid breathing.  Signs of a blood clot in your arms or legs include new unexplained swelling and cramping, warm, red or darkened skin around the painful area.  Please call the office or 911 right away if these signs or symptoms develop.  PRECAUTIONS:   If you experience chest pain or shortness of breath - call 911 immediately for transfer to the hospital emergency department.   If you develop a fever greater that 101 F, purulent drainage from wound, increased redness or drainage from wound, foul odor from the wound/dressing, or calf pain - CONTACT YOUR SURGEON.                                                   FOLLOW-UP APPOINTMENTS:  If you do not already have a post-op appointment, please call the office for an appointment to be seen by your surgeon.  Guidelines for how soon to be seen are listed in your "After Visit Summary", but are typically between 2-3 weeks after surgery.  If you have a specialized bandage, you may be told to follow up 1 week after surgery.  POST-OPERATIVE OPIOID TAPER INSTRUCTIONS: It is important to wean off of your opioid medication as soon as possible. If you do not need pain medication after your surgery it is ok to stop day one. Opioids include: Codeine, Hydrocodone (Norco, Vicodin), Oxycodone (Percocet, oxycontin ) and hydromorphone  amongst others.  Long term and even short term use of opiods can cause: Increased pain response Dependence Constipation Depression Respiratory depression And more.  Withdrawal symptoms can include Flu like symptoms Nausea, vomiting And more Techniques to manage these symptoms Hydrate well Eat regular healthy meals Stay active Use relaxation techniques(deep breathing, meditating, yoga) Do Not substitute Alcohol to help with tapering If you  have been on opioids for less than two weeks and do not have pain than it is ok to stop all together.  Plan to wean off  of opioids This plan should start within one week post op of your joint replacement. Maintain the same interval or time between taking each dose and first decrease the dose.  Cut the total daily intake of opioids by one tablet each day Next start to increase the time between doses. The last dose that should be eliminated is the evening dose.   MAKE SURE YOU:  Understand these instructions.  Get help right away if you are not doing well or get worse.    Thank you for letting us  be a part of your medical care team.  It is a privilege we respect greatly.  We hope these instructions will help you stay on track for a fast and full recovery!

## 2023-12-10 NOTE — Anesthesia Procedure Notes (Signed)
 Procedure Name: Intubation Date/Time: 12/10/2023 12:31 PM  Performed by: [place], Avi Lemma, CRNAPre-anesthesia Checklist: Patient identified, Emergency Drugs available, Suction available and Patient being monitored Patient Re-evaluated:Patient Re-evaluated prior to induction Oxygen Delivery Method: Circle system utilized Preoxygenation: Pre-oxygenation with 100% oxygen Induction Type: IV induction Ventilation: Mask ventilation without difficulty Laryngoscope Size: Mac and 4 Grade View: Grade I Tube type: Oral Tube size: 7.5 mm Number of attempts: 1 Airway Equipment and Method: Stylet and Oral airway Placement Confirmation: ETT inserted through vocal cords under direct vision, positive ETCO2 and breath sounds checked- equal and bilateral Secured at: 22 cm Tube secured with: Tape Dental Injury: Teeth and Oropharynx as per pre-operative assessment

## 2023-12-10 NOTE — Interval H&P Note (Signed)
 The patient has been re-examined, and the chart reviewed, and there have been no interval changes to the documented history and physical.    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  Discussed plan for hopeful headliner exchange if acetabular shell and femoral stem are well-fixed and undamaged.  However, we will be prepared to revise cup or stem if these components are loose or damaged as needed.  The operative side was examined and the patient was confirmed to have sensation to DPN, SPN, TN intact, Motor EHL, ext, flex 5/5, and DP 2+, PT 2+, No significant edema.   The risks, benefits, and alternatives have been discussed at length with patient, and the patient is willing to proceed.  Left hip marked. Consent has been signed.

## 2023-12-10 NOTE — Transfer of Care (Signed)
 Immediate Anesthesia Transfer of Care Note  Patient: [name]  Procedure(s) Performed: PARTIAL HIP REVISION (Left: Hip) APPLICATION, WOUND VAC (Left)  Patient Location: PACU  Anesthesia Type:General  Level of Consciousness: sedated  Airway & Oxygen Therapy: Patient Spontanous Breathing and Patient connected to face mask oxygen  Post-op Assessment: Report given to RN and Post -op Vital signs reviewed and stable  Post vital signs: Reviewed and stable  Last Vitals:  Vitals Value Taken Time  BP 131/86 12/10/23 1619  Temp    Pulse 70 12/10/23 1620  Resp 15 12/10/23 1620  SpO2 100 % 12/10/23 1620  Vitals shown include unfiled device data.  Last Pain:  Vitals:   12/10/23 1105  TempSrc:   PainSc: 0-No pain         Complications: No notable events documented.

## 2023-12-11 ENCOUNTER — Other Ambulatory Visit: Payer: Self-pay

## 2023-12-11 ENCOUNTER — Encounter (HOSPITAL_COMMUNITY): Payer: Self-pay | Admitting: Orthopedic Surgery

## 2023-12-11 LAB — CBC
HCT: 25.2 % — ABNORMAL LOW (ref 39.0–52.0)
Hemoglobin: 8.3 g/dL — ABNORMAL LOW (ref 13.0–17.0)
MCH: 32.4 pg (ref 26.0–34.0)
MCHC: 32.9 g/dL (ref 30.0–36.0)
MCV: 98.4 fL (ref 80.0–100.0)
Platelets: 96 10*3/uL — ABNORMAL LOW (ref 150–400)
RBC: 2.56 MIL/uL — ABNORMAL LOW (ref 4.22–5.81)
RDW: 12.7 % (ref 11.5–15.5)
WBC: 4.4 10*3/uL (ref 4.0–10.5)
nRBC: 0 % (ref 0.0–0.2)

## 2023-12-11 LAB — BASIC METABOLIC PANEL WITH GFR
Anion gap: 8 (ref 5–15)
BUN: 16 mg/dL (ref 8–23)
CO2: 23 mmol/L (ref 22–32)
Calcium: 8.2 mg/dL — ABNORMAL LOW (ref 8.9–10.3)
Chloride: 101 mmol/L (ref 98–111)
Creatinine, Ser: 0.75 mg/dL (ref 0.61–1.24)
GFR, Estimated: >60 mL/min (ref >60)
Glucose, Bld: 131 mg/dL — ABNORMAL HIGH (ref 70–99)
Potassium: 4 mmol/L (ref 3.5–5.1)
Sodium: 132 mmol/L — ABNORMAL LOW (ref 135–145)

## 2023-12-11 MED ORDER — PROPOFOL 10 MG/ML IV BOLUS
INTRAVENOUS | Status: AC
  Filled 2023-12-11: qty 20

## 2023-12-11 MED ORDER — CELECOXIB 100 MG PO CAPS
100.0000 mg | ORAL_CAPSULE | Freq: Two times a day (BID) | ORAL | Status: DC
Start: 2023-12-11 — End: 2023-12-12
  Administered 2023-12-11 – 2023-12-12 (×3): 100 mg via ORAL
  Filled 2023-12-11 (×3): qty 1

## 2023-12-11 NOTE — TOC Initial Note (Addendum)
 Transition of Care [facility]) - Initial/Assessment Note    Patient Details  Name: [name] MRN: [id number] Date of Birth: 1940-09-05  Transition of Care [facility]) CM/SW Contact:    Delilah Fend, LCSW Phone Number: 12/11/2023, 1:37 PM  Clinical Narrative:                  Met with pt and spouse today to review dc planning needs.  They confirm that they had reached out to [facility] prior to surgery with anticipation he would need rehab following this stay.  Have confirmed with [facility] that they are aware of pt and able to offer SNF bed.  Per PT evaluation today, SNF is the recommended plan.  Have begun insurance authorization with [employer] for both SNF and PTAR transport.  Await auth and hopeful can secure bed and dc by tomorrow.  Contact at [facility] is Crystal at [phone].  Expected Discharge Plan: Skilled Nursing Facility Barriers to Discharge: English as a second language teacher, Continued Medical Work up   Patient Goals and CMS Choice Patient states their goals for this hospitalization and ongoing recovery are:: return home following SNF rehab          Expected Discharge Plan and Services In-house Referral: Clinical Social Work   Post Acute Care Choice: Skilled Nursing Facility Living arrangements for the past 2 months: Single Family Home                 DME Arranged: N/A DME Agency: NA                  Prior Living Arrangements/Services Living arrangements for the past 2 months: Single Family Home Lives with:: Spouse Patient language and need for interpreter reviewed:: Yes Do you feel safe going back to the place where you live?: Yes      Need for Family Participation in Patient Care: Yes (Comment) Care giver support system in place?: Yes (comment)   Criminal Activity/Legal Involvement Pertinent to Current Situation/Hospitalization: No - Comment as needed  Activities of Daily Living   ADL Screening (condition at time of admission) Independently  performs ADLs?: Yes (appropriate for developmental age) Is the patient deaf or have difficulty hearing?: Yes Does the patient have difficulty seeing, even when wearing glasses/contacts?: No Does the patient have difficulty concentrating, remembering, or making decisions?: No  Permission Sought/Granted Permission sought to share information with : Family Supports Permission granted to share information with : Yes, Verbal Permission Granted  Share Information with NAME: spouse, Lyndon Chapel @ [phone]           Emotional Assessment Appearance:: Appears stated age Attitude/Demeanor/Rapport: Engaged, Gracious Affect (typically observed): Accepting Orientation: : Oriented to Self, Oriented to Place, Oriented to  Time, Oriented to Situation Alcohol / Substance Use: Not Applicable Psych Involvement: No (comment)  Admission diagnosis:  Failed total hip arthroplasty (HCC) [T84.018A, Z96.649] Patient Active Problem List   Diagnosis Date Noted   Failed total hip arthroplasty (HCC) 12/10/2023   S/P drug eluting coronary stent placement 08/26/2023   CAD (coronary artery disease) 04/11/2021   Syncope 04/10/2021   Hypokalemia 04/10/2021   Chest pain 04/09/2021   Gastro-esophageal reflux disease with esophagitis 06/30/2020   Lumbar disc disease 06/30/2020   Atrial fibrillation and flutter (HCC) 04/03/2020   Pain in left knee 12/18/2018   Amputation of right arm (HCC) 08/03/2018   Medicare annual wellness visit, initial 08/03/2018   Neuromuscular scoliosis of thoracolumbar region 04/02/2017   Varicose veins of bilateral lower  extremities with pain 05/03/2016   Benign essential hypertension 04/01/2016   PCP:  Sari Cunning, MD Pharmacy:   [facility] [id number] - [place], [place] - [address] [place] [address] Phone: [phone] Fax: [phone]  [facility] - [place] , [place] - [address] [place]  [place]  [address] Phone: [phone] Fax: [phone]     Social Drivers of Health (SDOH) Social History: SDOH Screenings   Food Insecurity: No Food Insecurity (12/10/2023)  Housing: Low Risk  (12/10/2023)  Transportation Needs: No Transportation Needs (12/10/2023)  Utilities: Not At Risk (12/10/2023)  Financial Resource Strain: Low Risk  (12/09/2023)   Received from [facility]  Social Connections: Moderately Integrated (12/10/2023)  Tobacco Use: Medium Risk (12/10/2023)   SDOH Interventions:     Readmission Risk Interventions    12/11/2023   10:26 AM  Readmission Risk Prevention Plan  Post Dischage Appt Complete  Medication Screening Complete  Transportation Screening Complete

## 2023-12-11 NOTE — Evaluation (Signed)
 Physical Therapy Evaluation Patient Details Name: [name] MRN: [id number] DOB: 24-Sep-1940 Today's Date: 12/11/2023  History of Present Illness  Pt s/p L THR revision to constrained liner by posterior approach.  Pt with hx of bil THR, CAD, and R UE amputation (1960)  Clinical Impression  Pt admitted as above and presenting with functional mobility limitations 2* decreased L LE strength/ROM, post op pain, posterior THP, and prior R UE amputation.  Patient will benefit from continued inpatient follow up therapy, <3 hours/day - dependent on acute stay progress.         If plan is discharge home, recommend the following: A lot of help with walking and/or transfers;A little help with bathing/dressing/bathroom;Assistance with cooking/housework;Assist for transportation;Help with stairs or ramp for entrance   Can travel by private vehicle        Equipment Recommendations None recommended by PT  Recommendations for Other Services       Functional Status Assessment Patient has had a recent decline in their functional status and demonstrates the ability to make significant improvements in function in a reasonable and predictable amount of time.     Precautions / Restrictions Precautions Precautions: Posterior Hip;Fall Precaution Booklet Issued: Yes (comment) Restrictions Weight Bearing Restrictions Per Provider Order: Yes Other Position/Activity Restrictions: WBAT      Mobility  Bed Mobility Overal bed mobility: Needs Assistance Bed Mobility: Supine to Sit     Supine to sit: +2 for physical assistance, +2 for safety/equipment, Mod assist     General bed mobility comments: Increased time with cues for sequence and use of R LE to self assist.  Physical assist to manage L LE and to control trunk    Transfers Overall transfer level: Needs assistance Equipment used: Rolling walker (2 wheels) Transfers: Sit to/from Stand Sit to Stand: Min assist, Mod assist, +2 physical  assistance, +2 safety/equipment, From elevated surface           General transfer comment: Adherence to THP, management of LEs and use of L UE to self assist.  Physical assist to bring wt up and fwd and to balance in initial standing    Ambulation/Gait Ambulation/Gait assistance: Min assist, Mod assist, +2 safety/equipment Gait Distance (Feet): 38 Feet Assistive device: Rolling walker (2 wheels), Quad cane Gait Pattern/deviations: Step-to pattern, Decreased step length - right, Decreased step length - left, Shuffle, Trunk flexed Gait velocity: decreased     General Gait Details: cues for sequence, posture and QC placement  Stairs            Wheelchair Mobility     Tilt Bed    Modified Rankin (Stroke Patients Only)       Balance Overall balance assessment: Needs assistance Sitting-balance support: No upper extremity supported, Feet supported Sitting balance-Leahy Scale: Fair     Standing balance support: Single extremity supported Standing balance-Leahy Scale: Poor                               Pertinent Vitals/Pain Pain Assessment Pain Assessment: 0-10 Pain Score: 4  Pain Location: L hip Pain Descriptors / Indicators: Aching, Sore Pain Intervention(s): Limited activity within patient's tolerance, Monitored during session, Premedicated before session, Ice applied    Home Living Family/patient expects to be discharged to:: Private residence Living Arrangements: Spouse/significant other Available Help at Discharge: Available 24 hours/day Type of Home: House Home Access: Stairs to enter Entrance Stairs-Rails: Lawyer of Steps: 2  Home Layout: One level Home Equipment: Cane - quad      Prior Function Prior Level of Function : Independent/Modified Independent                     Extremity/Trunk Assessment   Upper Extremity Assessment Upper Extremity Assessment: RUE deficits/detail RUE Deficits /  Details: amputated 1960    Lower Extremity Assessment Lower Extremity Assessment: LLE deficits/detail LLE Deficits / Details: AAROM at hip to 75 flex and 20 abd;    Cervical / Trunk Assessment Cervical / Trunk Assessment: Normal  Communication   Communication Communication: No apparent difficulties    Cognition Arousal: Alert Behavior During Therapy: WFL for tasks assessed/performed   PT - Cognitive impairments: No apparent impairments                         Following commands: Intact       Cueing Cueing Techniques: Verbal cues     General Comments      Exercises Total Joint Exercises Ankle Circles/Pumps: AROM, Both, 15 reps, Supine Quad Sets: AROM, Both, 10 reps, Supine Heel Slides: AAROM, Left, 15 reps, Supine Hip ABduction/ADduction: AAROM, Left, 10 reps, Supine   Assessment/Plan    PT Assessment Patient needs continued PT services  PT Problem List Decreased strength;Decreased range of motion;Decreased activity tolerance;Decreased balance;Decreased mobility;Decreased knowledge of use of DME;Pain       PT Treatment Interventions DME instruction;Gait training;Stair training;Functional mobility training;Therapeutic activities;Therapeutic exercise;Patient/family education    PT Goals (Current goals can be found in the Care Plan section)  Acute Rehab PT Goals Patient Stated Goal: Regain IND PT Goal Formulation: With patient Time For Goal Achievement: 12/18/23 Potential to Achieve Goals: Good    Frequency 7X/week     Co-evaluation               AM-PAC PT "6 Clicks" Mobility  Outcome Measure Help needed turning from your back to your side while in a flat bed without using bedrails?: A Lot Help needed moving from lying on your back to sitting on the side of a flat bed without using bedrails?: A Lot Help needed moving to and from a bed to a chair (including a wheelchair)?: A Lot Help needed standing up from a chair using your arms (e.g.,  wheelchair or bedside chair)?: A Lot Help needed to walk in hospital room?: A Lot Help needed climbing 3-5 steps with a railing? : A Lot 6 Click Score: 12    End of Session Equipment Utilized During Treatment: Gait belt Activity Tolerance: Patient tolerated treatment well;Patient limited by fatigue Patient left: in chair;with call bell/phone within reach;with chair alarm set;with family/visitor present Nurse Communication: Mobility status PT Visit Diagnosis: Difficulty in walking, not elsewhere classified (R26.2)    Time: 9604-5409 PT Time Calculation (min) (ACUTE ONLY): 26 min   Charges:   PT Evaluation $PT Eval Low Complexity: 1 Low PT Treatments $Gait Training: 8-22 mins PT General Charges $$ ACUTE PT VISIT: 1 Visit         Thedora Finlay PT Acute Rehabilitation Services Pager [phone] Office [phone]   Hayze Gazda 12/11/2023, 1:09 PM

## 2023-12-11 NOTE — Plan of Care (Signed)
  Problem: Education: Goal: Knowledge of General Education information will improve Description: Including pain rating scale, medication(s)/side effects and non-pharmacologic comfort measures Outcome: Progressing   Problem: Health Behavior/Discharge Planning: Goal: Ability to manage health-related needs will improve Outcome: Progressing   Problem: Clinical Measurements: Goal: Ability to maintain clinical measurements within normal limits will improve Outcome: Progressing Goal: Will remain free from infection Outcome: Progressing Goal: Diagnostic test results will improve Outcome: Progressing Goal: Respiratory complications will improve Outcome: Progressing Goal: Cardiovascular complication will be avoided Outcome: Progressing   Problem: Activity: Goal: Risk for activity intolerance will decrease Outcome: Progressing   Problem: Nutrition: Goal: Adequate nutrition will be maintained Outcome: Adequate for Discharge   Problem: Coping: Goal: Level of anxiety will decrease Outcome: Progressing   Problem: Elimination: Goal: Will not experience complications related to bowel motility Outcome: Progressing Goal: Will not experience complications related to urinary retention Outcome: Progressing   Problem: Pain Managment: Goal: General experience of comfort will improve and/or be controlled Outcome: Progressing   Problem: Safety: Goal: Ability to remain free from injury will improve Outcome: Progressing   Problem: Skin Integrity: Goal: Risk for impaired skin integrity will decrease Outcome: Progressing   Problem: Education: Goal: Knowledge of the prescribed therapeutic regimen will improve Outcome: Progressing Goal: Understanding of discharge needs will improve Outcome: Progressing Goal: Individualized Educational Video(s) Outcome: Completed/Met   Problem: Activity: Goal: Ability to avoid complications of mobility impairment will improve Outcome: Progressing    Problem: Activity: Goal: Ability to avoid complications of mobility impairment will improve Outcome: Progressing   Problem: Activity: Goal: Ability to avoid complications of mobility impairment will improve Outcome: Progressing Goal: Ability to tolerate increased activity will improve Outcome: Progressing   Problem: Clinical Measurements: Goal: Postoperative complications will be avoided or minimized Outcome: Progressing   Problem: Pain Management: Goal: Pain level will decrease with appropriate interventions Outcome: Progressing   Problem: Skin Integrity: Goal: Will show signs of wound healing Outcome: Progressing

## 2023-12-11 NOTE — Progress Notes (Addendum)
     Subjective: Patient reports pain as mild.  No issues overnight. Denies distal n/t. Discussed plan for mobilization with PT today and importance of hip precautions. Hgb 8.3 today. Will continue to monitor.  Objective:   VITALS:   Vitals:   12/10/23 1908 12/10/23 2115 12/11/23 0148 12/11/23 0516  BP: 131/78 (!) 147/68 (!) 165/75 (!) 141/73  Pulse: (!) 54  (!) 55 92  Resp: 17 16 17 17   Temp: (!) 97.3 F (36.3 C) (!) 97.3 F (36.3 C)    TempSrc:      SpO2: 100% 100% 100% 94%  Weight: 97.1 kg     Height: 6\' 1"  (1.854 m)       Sensation intact distally Intact pulses distally Dorsiflexion/Plantar flexion intact Incision: dressing C/D/I Compartment soft    Lab Results  Component Value Date   WBC 4.4 12/11/2023   HGB 8.3 (L) 12/11/2023   HCT 25.2 (L) 12/11/2023   MCV 98.4 12/11/2023   PLT 96 (L) 12/11/2023   BMET    Component Value Date/Time   NA 132 (L) 12/11/2023 0339   K 4.0 12/11/2023 0339   CL 101 12/11/2023 0339   CO2 23 12/11/2023 0339   GLUCOSE 131 (H) 12/11/2023 0339   BUN 16 12/11/2023 0339   CREATININE 0.75 12/11/2023 0339   CALCIUM  8.2 (L) 12/11/2023 0339   GFRNONAA >60 12/11/2023 4403    Xray: THA compnents with constrained liner in good position no adverse features  Assessment/Plan: 1 Day Post-Op   Principal Problem:   Failed total hip arthroplasty (HCC)  S/p left hip revision to constrained liner and heterotopic bone resection 12/10/23  Post op recs: WB: WBAT LLE, posterior hip precautions Abx: ancef in-house x 3 days discharged on cefadroxil, follow up intra-op cxs Imaging: PACU pelvis Xray Dressing: Prevena wound vac, keep intact until follow up HO prophylaxis: Toradol and celebrex DVT prophylaxis: Resume Eliquis  2.5 mg postop day 1 and 2 and then resume Eliquis  5 mg postop day 3.  Resume Plavix  75 mg daily starting postop day 2 Follow up: 2 weeks after surgery for a wound check with Dr. Pryor Browning at [facility].   Address: [address], [place], [place] [address]  Office Phone: [phone]  Murleen Arms 12/11/2023, 6:56 AM   Priscille Brought, MD  Contact information:   [phone] 7am-5pm epic message Dr. Pryor Browning, or call office for patient follow up: [phone] After hours and holidays please check [email or web address] for group call information for [facility]

## 2023-12-11 NOTE — Progress Notes (Signed)
 Physical Therapy Treatment Patient Details Name: [name] MRN: [id number] DOB: 02/14/1941 Today's Date: 12/11/2023   History of Present Illness Pt s/p L THR revision to constrained liner by posterior approach.  Pt with hx of bil THR, CAD, and R UE amputation (1960)    PT Comments  Pt continues very cooperative and with noted improvement in stability with move to hemi walker.  Pt continues to require significant assist for transfers and bed mobility tasks and with limited assist at home Patient will benefit from continued inpatient follow up therapy, <3 hours/day.     If plan is discharge home, recommend the following: A lot of help with walking and/or transfers;A little help with bathing/dressing/bathroom;Assistance with cooking/housework;Assist for transportation;Help with stairs or ramp for entrance   Can travel by private vehicle        Equipment Recommendations  None recommended by PT    Recommendations for Other Services       Precautions / Restrictions Precautions Precautions: Posterior Hip;Fall Precaution Booklet Issued: Yes (comment) Precaution/Restrictions Comments: pt recalls 2/3 THP without cues Restrictions Weight Bearing Restrictions Per Provider Order: Yes Other Position/Activity Restrictions: WBAT     Mobility  Bed Mobility Overal bed mobility: Needs Assistance Bed Mobility: Sit to Supine     Supine to sit: +2 for physical assistance, +2 for safety/equipment, Mod assist Sit to supine: Min assist, Mod assist   General bed mobility comments: Increased time with cues for sequence and use of R LE to self assist.  Physical assist to manage L LE and to control trunk    Transfers Overall transfer level: Needs assistance Equipment used: Rolling walker (2 wheels) Transfers: Sit to/from Stand Sit to Stand: Min assist, Mod assist, +2 physical assistance, +2 safety/equipment           General transfer comment: Adherence to THP, management of LEs and use  of L UE to self assist.  Physical assist to bring wt up and fwd and to balance in initial standing    Ambulation/Gait Ambulation/Gait assistance: Min assist, +2 safety/equipment Gait Distance (Feet): 38 Feet (38' twice) Assistive device: Rolling walker (2 wheels), Hemi-walker Gait Pattern/deviations: Step-to pattern, Decreased step length - right, Decreased step length - left, Shuffle, Trunk flexed Gait velocity: decreased     General Gait Details: cues for sequence, posture and HW placement   Stairs             Wheelchair Mobility     Tilt Bed    Modified Rankin (Stroke Patients Only)       Balance Overall balance assessment: Needs assistance Sitting-balance support: No upper extremity supported, Feet supported Sitting balance-Leahy Scale: Fair     Standing balance support: Single extremity supported Standing balance-Leahy Scale: Poor                              Communication Communication Communication: No apparent difficulties  Cognition Arousal: Alert Behavior During Therapy: WFL for tasks assessed/performed   PT - Cognitive impairments: No apparent impairments                         Following commands: Intact      Cueing Cueing Techniques: Verbal cues  Exercises Total Joint Exercises Ankle Circles/Pumps: AROM, Both, 15 reps, Supine Quad Sets: AROM, Both, 10 reps, Supine Heel Slides: AAROM, Left, 15 reps, Supine Hip ABduction/ADduction: AAROM, Left, 10 reps, Supine    General Comments  Pertinent Vitals/Pain Pain Assessment Pain Assessment: 0-10 Pain Score: 4  Pain Location: L hip Pain Descriptors / Indicators: Aching, Sore Pain Intervention(s): Limited activity within patient's tolerance, Monitored during session, Premedicated before session, Ice applied    Home Living Family/patient expects to be discharged to:: Private residence Living Arrangements: Spouse/significant other Available Help at Discharge:  Available 24 hours/day Type of Home: House Home Access: Stairs to enter Entrance Stairs-Rails: Lawyer of Steps: 2   Home Layout: One level Home Equipment: Cane - quad      Prior Function            PT Goals (current goals can now be found in the care plan section) Acute Rehab PT Goals Patient Stated Goal: Regain IND PT Goal Formulation: With patient Time For Goal Achievement: 12/18/23 Potential to Achieve Goals: Good Progress towards PT goals: Progressing toward goals    Frequency    7X/week      PT Plan      Co-evaluation              AM-PAC PT "6 Clicks" Mobility   Outcome Measure  Help needed turning from your back to your side while in a flat bed without using bedrails?: A Lot Help needed moving from lying on your back to sitting on the side of a flat bed without using bedrails?: A Lot Help needed moving to and from a bed to a chair (including a wheelchair)?: A Lot Help needed standing up from a chair using your arms (e.g., wheelchair or bedside chair)?: A Lot Help needed to walk in hospital room?: A Little Help needed climbing 3-5 steps with a railing? : A Lot 6 Click Score: 13    End of Session Equipment Utilized During Treatment: Gait belt Activity Tolerance: Patient tolerated treatment well;Patient limited by fatigue Patient left: in bed;with call bell/phone within reach;with family/visitor present Nurse Communication: Mobility status PT Visit Diagnosis: Difficulty in walking, not elsewhere classified (R26.2)     Time: 1345-1406 PT Time Calculation (min) (ACUTE ONLY): 21 min  Charges:    $Gait Training: 8-22 mins PT General Charges $$ ACUTE PT VISIT: 1 Visit                     [name] PT Acute Rehabilitation Services Pager [phone] Office [phone]    [name] 12/11/2023, 2:09 PM

## 2023-12-11 NOTE — [place] FL2 (Signed)
 [place]  MEDICAID FL2 LEVEL OF CARE FORM     IDENTIFICATION  Patient Name: [name] Birthdate: [birth year] Sex: male Admission Date (Current Location): 12/10/2023  [facility] and [place] Number:  Producer, television/film/video and Address:  [facility],  [address] [place], [place] [address]      Provider Number: [id number]  Attending Physician Name and Address:  Murleen Arms, MD  Relative Name and Phone Number:  wife, Aadam Zhen @ [phone]    Current Level of Care: Hospital Recommended Level of Care: Skilled Nursing Facility Prior Approval Number:    Date Approved/Denied:   PASRR Number: [id number]  Discharge Plan: SNF    Current Diagnoses: Patient Active Problem List   Diagnosis Date Noted   Failed total hip arthroplasty (HCC) 12/10/2023   S/P drug eluting coronary stent placement 08/26/2023   CAD (coronary artery disease) 04/11/2021   Syncope 04/10/2021   Hypokalemia 04/10/2021   Chest pain 04/09/2021   Gastro-esophageal reflux disease with esophagitis 06/30/2020   Lumbar disc disease 06/30/2020   Atrial fibrillation and flutter (HCC) 04/03/2020   Pain in left knee 12/18/2018   Amputation of right arm (HCC) 08/03/2018   Medicare annual wellness visit, initial 08/03/2018   Neuromuscular scoliosis of thoracolumbar region 04/02/2017   Varicose veins of bilateral lower extremities with pain 05/03/2016   Benign essential hypertension 04/01/2016    Orientation RESPIRATION BLADDER Height & Weight     Time, Self, Situation, Place  Normal Continent Weight: 214 lb 1.1 oz (97.1 kg) Height:  6\' 1"  (185.4 cm)  BEHAVIORAL SYMPTOMS/MOOD NEUROLOGICAL BOWEL NUTRITION STATUS      Continent Diet (regular)  AMBULATORY STATUS COMMUNICATION OF NEEDS Skin   Limited Assist Verbally Other (Comment) (surgical incision only)                       Personal Care Assistance Level of Assistance  Bathing, Dressing Bathing Assistance: Limited assistance    Dressing Assistance: Limited assistance     Functional Limitations Info  Sight, Hearing, Speech Sight Info: Adequate Hearing Info: Adequate Speech Info: Adequate    SPECIAL CARE FACTORS FREQUENCY  PT (By licensed PT), OT (By licensed OT)     PT Frequency: 5x/wk OT Frequency: 5x/wk            Contractures Contractures Info: Not present    Additional Factors Info  Code Status, Allergies Code Status Info: Full Allergies Info: tramadol           Current Medications (12/11/2023):  This is the current hospital active medication list Current Facility-Administered Medications  Medication Dose Route Frequency Provider Last Rate Last Admin   acetaminophen  (TYLENOL ) tablet 1,000 mg  1,000 mg Oral Q6H Cockerham, Alicia M, PA-C   1,000 mg at 12/11/23 2841   acetaminophen  (TYLENOL ) tablet 325-650 mg  325-650 mg Oral Q6H PRN Cockerham, Alicia M, PA-C       apixaban  (ELIQUIS ) tablet 2.5 mg  2.5 mg Oral BID Cockerham, Alicia M, PA-C       [START ON 12/13/2023] apixaban  (ELIQUIS ) tablet 5 mg  5 mg Oral BID Cockerham, Alicia M, PA-C       [START ON 12/13/2023] cefadroxil (DURICEF) capsule 500 mg  500 mg Oral BID Cockerham, Alicia M, PA-C       ceFAZolin (ANCEF) IVPB 2g/100 mL premix  2 g Intravenous Q8H Cockerham, Alicia M, PA-C 200 mL/hr at 12/11/23 3244 2 g at 12/11/23 0102   celecoxib (CELEBREX) capsule 100 mg  100 mg Oral BID Murleen Arms, MD       diphenhydrAMINE (BENADRYL) 12.5 MG/5ML elixir 12.5-25 mg  12.5-25 mg Oral Q4H PRN Cockerham, Alicia M, PA-C       docusate sodium (COLACE) capsule 100 mg  100 mg Oral BID Cockerham, Alicia M, PA-C       donepezil  (ARICEPT ) tablet 10 mg  10 mg Oral QHS Cockerham, Alicia M, PA-C   10 mg at 12/10/23 2200   furosemide  (LASIX ) tablet 20 mg  20 mg Oral Daily Cockerham, Alicia M, PA-C       HYDROmorphone  (DILAUDID ) injection 0.5-1 mg  0.5-1 mg Intravenous Q4H PRN Cockerham, Alicia M, PA-C       lactated ringers  infusion   Intravenous  Continuous Cockerham, Alicia M, PA-C 75 mL/hr at 12/11/23 0405 New Bag at 12/11/23 0405   losartan  (COZAAR ) tablet 25 mg  25 mg Oral Daily Cockerham, Alicia M, PA-C       menthol-cetylpyridinium (CEPACOL) lozenge 3 mg  1 lozenge Oral PRN Cockerham, Alicia M, PA-C       Or   phenol (CHLORASEPTIC) mouth spray 1 spray  1 spray Mouth/Throat PRN Cockerham, Alicia M, PA-C       methocarbamol  (ROBAXIN ) tablet 500 mg  500 mg Oral Q6H PRN Cockerham, Alicia M, PA-C       Or   methocarbamol  (ROBAXIN ) injection 500 mg  500 mg Intravenous Q6H PRN Cockerham, Alicia M, PA-C   500 mg at 12/10/23 1653   metoprolol  succinate (TOPROL -XL) 24 hr tablet 50 mg  50 mg Oral Daily Cockerham, Alicia M, PA-C       ondansetron  (ZOFRAN ) tablet 4 mg  4 mg Oral Q6H PRN Cockerham, Alicia M, PA-C       Or   ondansetron  (ZOFRAN ) injection 4 mg  4 mg Intravenous Q6H PRN Cockerham, Alicia M, PA-C       oxyCODONE  (Oxy IR/ROXICODONE ) immediate release tablet 5-10 mg  5-10 mg Oral Q4H PRN Cockerham, Alicia M, PA-C   5 mg at 12/11/23 1610   pantoprazole  (PROTONIX ) EC tablet 40 mg  40 mg Oral Daily Cockerham, Alicia M, PA-C       polyethylene glycol (MIRALAX  / GLYCOLAX ) packet 17 g  17 g Oral Daily PRN Cockerham, Alicia M, PA-C       rosuvastatin  (CRESTOR ) tablet 5 mg  5 mg Oral Daily Cockerham, Alicia M, PA-C       senna (SENOKOT) tablet 8.6 mg  1 tablet Oral BID Cockerham, Alicia M, PA-C       tamsulosin  (FLOMAX ) capsule 0.4 mg  0.4 mg Oral QHS Cockerham, Alicia M, PA-C   0.4 mg at 12/10/23 2159     Discharge Medications: Please see discharge summary for a list of discharge medications.  Relevant Imaging Results:  Relevant Lab Results:   Additional Information SS# [id number]  Delilah Fend, LCSW

## 2023-12-12 ENCOUNTER — Other Ambulatory Visit: Payer: Self-pay | Admitting: Student

## 2023-12-12 DIAGNOSIS — Z89221 Acquired absence of right upper limb above elbow: Secondary | ICD-10-CM | POA: Diagnosis not present

## 2023-12-12 DIAGNOSIS — Z955 Presence of coronary angioplasty implant and graft: Secondary | ICD-10-CM | POA: Diagnosis not present

## 2023-12-12 DIAGNOSIS — Z96642 Presence of left artificial hip joint: Secondary | ICD-10-CM | POA: Diagnosis not present

## 2023-12-12 DIAGNOSIS — R278 Other lack of coordination: Secondary | ICD-10-CM | POA: Diagnosis not present

## 2023-12-12 DIAGNOSIS — E782 Mixed hyperlipidemia: Secondary | ICD-10-CM | POA: Diagnosis not present

## 2023-12-12 DIAGNOSIS — I4892 Unspecified atrial flutter: Secondary | ICD-10-CM | POA: Insufficient documentation

## 2023-12-12 DIAGNOSIS — T84018A Broken internal joint prosthesis, other site, initial encounter: Secondary | ICD-10-CM | POA: Diagnosis not present

## 2023-12-12 DIAGNOSIS — K219 Gastro-esophageal reflux disease without esophagitis: Secondary | ICD-10-CM | POA: Insufficient documentation

## 2023-12-12 DIAGNOSIS — I4891 Unspecified atrial fibrillation: Secondary | ICD-10-CM | POA: Diagnosis not present

## 2023-12-12 DIAGNOSIS — R279 Unspecified lack of coordination: Secondary | ICD-10-CM | POA: Insufficient documentation

## 2023-12-12 DIAGNOSIS — T84061D Wear of articular bearing surface of internal prosthetic left hip joint, subsequent encounter: Secondary | ICD-10-CM | POA: Diagnosis not present

## 2023-12-12 DIAGNOSIS — R55 Syncope and collapse: Secondary | ICD-10-CM | POA: Diagnosis not present

## 2023-12-12 DIAGNOSIS — I2510 Atherosclerotic heart disease of native coronary artery without angina pectoris: Secondary | ICD-10-CM | POA: Diagnosis not present

## 2023-12-12 DIAGNOSIS — E538 Deficiency of other specified B group vitamins: Secondary | ICD-10-CM | POA: Diagnosis not present

## 2023-12-12 DIAGNOSIS — Z741 Need for assistance with personal care: Secondary | ICD-10-CM | POA: Insufficient documentation

## 2023-12-12 DIAGNOSIS — Z7401 Bed confinement status: Secondary | ICD-10-CM | POA: Diagnosis not present

## 2023-12-12 DIAGNOSIS — E876 Hypokalemia: Secondary | ICD-10-CM | POA: Diagnosis not present

## 2023-12-12 DIAGNOSIS — T84091A Other mechanical complication of internal left hip prosthesis, initial encounter: Secondary | ICD-10-CM | POA: Diagnosis not present

## 2023-12-12 DIAGNOSIS — S48911D Complete traumatic amputation of right shoulder and upper arm, level unspecified, subsequent encounter: Secondary | ICD-10-CM | POA: Diagnosis not present

## 2023-12-12 DIAGNOSIS — M6281 Muscle weakness (generalized): Secondary | ICD-10-CM | POA: Diagnosis not present

## 2023-12-12 DIAGNOSIS — R071 Chest pain on breathing: Secondary | ICD-10-CM | POA: Diagnosis not present

## 2023-12-12 DIAGNOSIS — M25551 Pain in right hip: Secondary | ICD-10-CM | POA: Diagnosis not present

## 2023-12-12 DIAGNOSIS — I110 Hypertensive heart disease with heart failure: Secondary | ICD-10-CM | POA: Diagnosis not present

## 2023-12-12 DIAGNOSIS — G3184 Mild cognitive impairment, so stated: Secondary | ICD-10-CM | POA: Diagnosis not present

## 2023-12-12 DIAGNOSIS — M4145 Neuromuscular scoliosis, thoracolumbar region: Secondary | ICD-10-CM | POA: Diagnosis not present

## 2023-12-12 DIAGNOSIS — I509 Heart failure, unspecified: Secondary | ICD-10-CM | POA: Diagnosis not present

## 2023-12-12 DIAGNOSIS — I83813 Varicose veins of bilateral lower extremities with pain: Secondary | ICD-10-CM | POA: Diagnosis not present

## 2023-12-12 LAB — CBC
HCT: 25.4 % — ABNORMAL LOW (ref 39.0–52.0)
Hemoglobin: 8.7 g/dL — ABNORMAL LOW (ref 13.0–17.0)
MCH: 33.3 pg (ref 26.0–34.0)
MCHC: 34.3 g/dL (ref 30.0–36.0)
MCV: 97.3 fL (ref 80.0–100.0)
Platelets: 103 10*3/uL — ABNORMAL LOW (ref 150–400)
RBC: 2.61 MIL/uL — ABNORMAL LOW (ref 4.22–5.81)
RDW: 12.8 % (ref 11.5–15.5)
WBC: 5.1 10*3/uL (ref 4.0–10.5)
nRBC: 0 % (ref 0.0–0.2)

## 2023-12-12 MED ORDER — ONDANSETRON HCL 4 MG PO TABS: 4.0000 mg | ORAL_TABLET | Freq: Three times a day (TID) | ORAL | 0 refills | Status: AC | PRN

## 2023-12-12 MED ORDER — OXYCODONE HCL 5 MG PO TABS
5.0000 mg | ORAL_TABLET | ORAL | 0 refills | Status: DC | PRN
Start: 2023-12-12 — End: 2023-12-12

## 2023-12-12 MED ORDER — OXYCODONE HCL 5 MG PO TABS: 5.0000 mg | ORAL_TABLET | ORAL | 0 refills | Status: AC | PRN

## 2023-12-12 MED ORDER — OXYCODONE HCL 5 MG PO TABS: 5.0000 mg | ORAL_TABLET | ORAL | 0 refills | Status: DC | PRN

## 2023-12-12 MED ORDER — CEFADROXIL 500 MG PO CAPS: 500.0000 mg | ORAL_CAPSULE | Freq: Two times a day (BID) | ORAL | 0 refills | Status: AC

## 2023-12-12 MED ORDER — POLYETHYLENE GLYCOL 3350 17 G PO PACK: 17.0000 g | PACK | Freq: Every day | ORAL | Status: DC

## 2023-12-12 MED ORDER — METHOCARBAMOL 500 MG PO TABS: 500.0000 mg | ORAL_TABLET | Freq: Three times a day (TID) | ORAL | 0 refills | Status: AC | PRN

## 2023-12-12 NOTE — Progress Notes (Signed)
 Physical Therapy Treatment Patient Details Name: [name] MRN: [id number] DOB: Dec 22, 1940 Today's Date: 12/12/2023   History of Present Illness Pt s/p L THR revision to constrained liner by posterior approach.  Pt with hx of bil THR, CAD, and R UE amputation (1960)    PT Comments  Pt very cooperative and performed therex program well with assist but with attempts to [facility] for ambulation was pain limited and assisted to chair after walking only short distance.  Pt had been accepting only Tylenol  for pain but agreeable to take oxycodone  and re attempt later this am.   If plan is discharge home, recommend the following: A lot of help with walking and/or transfers;A little help with bathing/dressing/bathroom;Assistance with cooking/housework;Assist for transportation;Help with stairs or ramp for entrance   Can travel by private vehicle        Equipment Recommendations  None recommended by PT    Recommendations for Other Services       Precautions / Restrictions Precautions Precautions: Posterior Hip;Fall Precaution/Restrictions Comments: pt recalls 2/3 THP without cues Restrictions Weight Bearing Restrictions Per Provider Order: No Other Position/Activity Restrictions: WBAT     Mobility  Bed Mobility Overal bed mobility: Needs Assistance Bed Mobility: Supine to Sit     Supine to sit: Min assist, Mod assist     General bed mobility comments: Increased time with cues for sequence and use of R LE to self assist.  Physical assist to manage L LE and to control trunk    Transfers Overall transfer level: Needs assistance Equipment used: Rolling walker (2 wheels) Transfers: Sit to/from Stand Sit to Stand: Min assist, Mod assist           General transfer comment: Adherence to THP, management of LEs and use of L UE to self assist.  Physical assist to bring wt up and fwd and to balance in initial standing    Ambulation/Gait Ambulation/Gait assistance: Min assist Gait  Distance (Feet): 17 Feet Assistive device: Hemi-walker Gait Pattern/deviations: Step-to pattern, Decreased step length - right, Decreased step length - left, Shuffle, Trunk flexed, Antalgic Gait velocity: decreased     General Gait Details: Increased time with cues for sequence, posture and HW placement   Stairs             Wheelchair Mobility     Tilt Bed    Modified Rankin (Stroke Patients Only)       Balance Overall balance assessment: Needs assistance Sitting-balance support: No upper extremity supported, Feet supported Sitting balance-Leahy Scale: Good     Standing balance support: Single extremity supported Standing balance-Leahy Scale: Poor                              Communication Communication Communication: No apparent difficulties  Cognition Arousal: Alert Behavior During Therapy: WFL for tasks assessed/performed   PT - Cognitive impairments: No apparent impairments                         Following commands: Intact      Cueing Cueing Techniques: Verbal cues  Exercises Total Joint Exercises Ankle Circles/Pumps: AROM, Both, Supine, 20 reps Quad Sets: AROM, Both, Supine, 15 reps Heel Slides: AAROM, Left, 15 reps, Supine Hip ABduction/ADduction: AAROM, Left, 10 reps, Supine    General Comments        Pertinent Vitals/Pain Pain Assessment Pain Assessment: 0-10 Pain Score: 10-Worst pain ever Pain Location: L  hip with WB; min pain at rest or with therex Pain Descriptors / Indicators: Aching, Sore, Grimacing, Guarding Pain Intervention(s): Limited activity within patient's tolerance, Monitored during session, Premedicated before session, Ice applied (pt accepted Tylenol  only)    Home Living                          Prior Function            PT Goals (current goals can now be found in the care plan section) Acute Rehab PT Goals Patient Stated Goal: Regain IND PT Goal Formulation: With patient Time For  Goal Achievement: 12/18/23 Potential to Achieve Goals: Good Progress towards PT goals: Progressing toward goals    Frequency    7X/week      PT Plan      Co-evaluation              AM-PAC PT "6 Clicks" Mobility   Outcome Measure  Help needed turning from your back to your side while in a flat bed without using bedrails?: A Lot Help needed moving from lying on your back to sitting on the side of a flat bed without using bedrails?: A Lot Help needed moving to and from a bed to a chair (including a wheelchair)?: A Lot Help needed standing up from a chair using your arms (e.g., wheelchair or bedside chair)?: A Lot Help needed to walk in hospital room?: A Little Help needed climbing 3-5 steps with a railing? : A Lot 6 Click Score: 13    End of Session Equipment Utilized During Treatment: Gait belt Activity Tolerance: Patient limited by pain Patient left: in chair;with call bell/phone within reach;with chair alarm set;with family/visitor present Nurse Communication: Mobility status PT Visit Diagnosis: Difficulty in walking, not elsewhere classified (R26.2)     Time: 4098-1191 PT Time Calculation (min) (ACUTE ONLY): 25 min  Charges:    $Gait Training: 8-22 mins $Therapeutic Exercise: 8-22 mins PT General Charges $$ ACUTE PT VISIT: 1 Visit                     Thedora Finlay PT Acute Rehabilitation Services Pager [phone] Office [phone]    Gabe Glace 12/12/2023, 11:53 AM

## 2023-12-12 NOTE — TOC Progression Note (Addendum)
 Transition of Care [facility]) - Progression Note    Patient Details  Name: [name] MRN: [id number] Date of Birth: 12/04/1940  Transition of [facility]) CM/SW Contact  Levie Ream, RN Phone Number: 12/12/2023, 10:34 AM  Clinical Narrative:    Received message from [place] at [facility] that ins auth for [facility] has been approved, auth # [id number]; she says auth for ambulance is still w/ medical director; Trevor Fudge can be contacted at [phone]; Crystal at [facility] notified; pt and wife at bedside also notified.  [phone] spoke w/ Trevor Fudge at [facility]; she says auth for ambulance has been received, auth # [id number]; she also confirmed auth for SNF, and pt has 5 days to transition to facility; Dr Pryor Browning notified via secure chat; d/c summary and d/c orders received; LVM for Crystal, SW at [facility] [phone]); awaiting return call; d/c summary and SNF transfer report sent via SNF hub.  Expected Discharge Plan: Skilled Nursing Facility Barriers to Discharge: English as a second language teacher, Continued Medical Work up  Expected Discharge Plan and Services In-house Referral: Clinical Social Work   Post Acute Care Choice: Skilled Nursing Facility Living arrangements for the past 2 months: Single Family Home                 DME Arranged: N/A DME Agency: NA                   Social Determinants of Health (SDOH) Interventions SDOH Screenings   Food Insecurity: No Food Insecurity (12/10/2023)  Housing: Low Risk  (12/10/2023)  Transportation Needs: No Transportation Needs (12/10/2023)  Utilities: Not At Risk (12/10/2023)  Financial Resource Strain: Low Risk  (12/09/2023)   Received from [facility]  Social Connections: Moderately Integrated (12/10/2023)  Tobacco Use: Medium Risk (12/10/2023)    Readmission Risk Interventions    12/11/2023   10:26 AM  Readmission Risk Prevention Plan  Post Dischage Appt Complete  Medication Screening Complete  Transportation  Screening Complete

## 2023-12-12 NOTE — TOC Transition Note (Signed)
 Transition of Care [facility]) - Discharge Note   Patient Details  Name: [name] MRN: [id number] Date of Birth: 08-Mar-1941  Transition of Care [facility]) CM/SW Contact:  Levie Ream, RN Phone Number: 12/12/2023, 11:57 AM   Clinical Narrative:    Return call for Crystal, SW at [facility]; she gave RM # [phone], call report # [phone]; D/C summary and SNF transfer report sent via SNF hub; transport by PT[facility]; pt/wife notified and agree to d/c plan; [facility] called at 1225; spoke w/ [place]; no TOC needs.   Final next level of care: Skilled Nursing Facility Barriers to Discharge: No Barriers Identified   Patient Goals and CMS Choice Patient states their goals for this hospitalization and ongoing recovery are:: return home following SNF rehab          Discharge Placement              Patient chooses bed at: [facility] Patient to be transferred to facility by: PT[facility] Name of family member notified: [name] (spouse) at bedside Patient and family notified of of transfer: 12/12/23  Discharge Plan and Services Additional resources added to the After Visit Summary for   In-house Referral: Clinical Social Work   Post Acute Care Choice: Skilled Nursing Facility          DME Arranged: N/A DME Agency: NA                  Social Drivers of Health (SDOH) Interventions SDOH Screenings   Food Insecurity: No Food Insecurity (12/10/2023)  Housing: Low Risk  (12/10/2023)  Transportation Needs: No Transportation Needs (12/10/2023)  Utilities: Not At Risk (12/10/2023)  Financial Resource Strain: Low Risk  (12/09/2023)   Received from [facility]  Social Connections: Moderately Integrated (12/10/2023)  Tobacco Use: Medium Risk (12/10/2023)     Readmission Risk Interventions    12/11/2023   10:26 AM  Readmission Risk Prevention Plan  Post Dischage Appt Complete  Medication Screening Complete  Transportation Screening Complete

## 2023-12-12 NOTE — Progress Notes (Signed)
 Physical Therapy Treatment Patient Details Name: [name] MRN: [id number] DOB: Jan 24, 1941 Today's Date: 12/12/2023   History of Present Illness Pt s/p L THR revision to constrained liner by posterior approach.  Pt with hx of bil THR, CAD, and R UE amputation (1960)    PT Comments  Pt continues very cooperative and reports improvement in pain control vs earlier this am.  Pt assisted up to ambulate increased distance in hall, assisted to bed and positioned for comfort.  Pt hopeful for dc to [facility] later this date.    If plan is discharge home, recommend the following: A lot of help with walking and/or transfers;A little help with bathing/dressing/bathroom;Assistance with cooking/housework;Assist for transportation;Help with stairs or ramp for entrance   Can travel by private vehicle        Equipment Recommendations  None recommended by PT    Recommendations for Other Services       Precautions / Restrictions Precautions Precautions: Posterior Hip;Fall Precaution/Restrictions Comments: pt recalls 2/3 THP without cues Restrictions Weight Bearing Restrictions Per Provider Order: No Other Position/Activity Restrictions: WBAT     Mobility  Bed Mobility Overal bed mobility: Needs Assistance Bed Mobility: Sit to Supine     Supine to sit: Min assist, Mod assist Sit to supine: Min assist, Mod assist   General bed mobility comments: Increased time with cues for sequence and use of R LE to self assist.  Physical assist to manage L LE and to control trunk    Transfers Overall transfer level: Needs assistance Equipment used: Rolling walker (2 wheels) Transfers: Sit to/from Stand Sit to Stand: Min assist, Mod assist           General transfer comment: Adherence to THP, management of LEs and use of L UE to self assist.  Physical assist to bring wt up and fwd and to balance in initial standing    Ambulation/Gait Ambulation/Gait assistance: Min assist Gait Distance  (Feet): 44 Feet Assistive device: Hemi-walker Gait Pattern/deviations: Step-to pattern, Decreased step length - right, Decreased step length - left, Shuffle, Trunk flexed, Antalgic Gait velocity: decreased     General Gait Details: Increased time with cues for sequence, posture and HW placement   Stairs             Wheelchair Mobility     Tilt Bed    Modified Rankin (Stroke Patients Only)       Balance Overall balance assessment: Needs assistance Sitting-balance support: No upper extremity supported, Feet supported Sitting balance-Leahy Scale: Good     Standing balance support: Single extremity supported Standing balance-Leahy Scale: Poor                              Communication Communication Communication: No apparent difficulties  Cognition Arousal: Alert Behavior During Therapy: WFL for tasks assessed/performed   PT - Cognitive impairments: No apparent impairments                         Following commands: Intact      Cueing Cueing Techniques: Verbal cues  Exercises Total Joint Exercises Ankle Circles/Pumps: AROM, Both, Supine, 20 reps Quad Sets: AROM, Both, Supine, 15 reps Heel Slides: AAROM, Left, 15 reps, Supine Hip ABduction/ADduction: AAROM, Left, 10 reps, Supine    General Comments        Pertinent Vitals/Pain Pain Assessment Pain Assessment: 0-10 Pain Score: 8  Pain Location: L hip with  WB; min pain at rest or with therex Pain Descriptors / Indicators: Aching, Sore, Grimacing, Guarding Pain Intervention(s): Limited activity within patient's tolerance, Monitored during session, Premedicated before session, Ice applied    Home Living                          Prior Function            PT Goals (current goals can now be found in the care plan section) Acute Rehab PT Goals Patient Stated Goal: Regain IND PT Goal Formulation: With patient Time For Goal Achievement: 12/18/23 Potential to Achieve  Goals: Good Progress towards PT goals: Progressing toward goals    Frequency    7X/week      PT Plan      Co-evaluation              AM-PAC PT "6 Clicks" Mobility   Outcome Measure  Help needed turning from your back to your side while in a flat bed without using bedrails?: A Lot Help needed moving from lying on your back to sitting on the side of a flat bed without using bedrails?: A Lot Help needed moving to and from a bed to a chair (including a wheelchair)?: A Lot Help needed standing up from a chair using your arms (e.g., wheelchair or bedside chair)?: A Lot Help needed to walk in hospital room?: A Little Help needed climbing 3-5 steps with a railing? : A Lot 6 Click Score: 13    End of Session Equipment Utilized During Treatment: Gait belt Activity Tolerance: Patient tolerated treatment well Patient left: with bed alarm set;in bed;with family/visitor present Nurse Communication: Mobility status PT Visit Diagnosis: Difficulty in walking, not elsewhere classified (R26.2)     Time: 1040-1059 PT Time Calculation (min) (ACUTE ONLY): 19 min  Charges:    $Gait Training: 8-22 mins $Therapeutic Exercise: 8-22 mins PT General Charges $$ ACUTE PT VISIT: 1 Visit                     Thedora Finlay PT Acute Rehabilitation Services Pager [phone] Office [phone]    Jessye Imhoff 12/12/2023, 11:59 AM

## 2023-12-12 NOTE — Plan of Care (Signed)
  Problem: Education: Goal: Knowledge of General Education information will improve Description: Including pain rating scale, medication(s)/side effects and non-pharmacologic comfort measures Outcome: Progressing   Problem: Health Behavior/Discharge Planning: Goal: Ability to manage health-related needs will improve Outcome: Progressing   Problem: Clinical Measurements: Goal: Ability to maintain clinical measurements within normal limits will improve Outcome: Progressing Goal: Will remain free from infection Outcome: Progressing Goal: Diagnostic test results will improve Outcome: Progressing Goal: Respiratory complications will improve Outcome: Progressing Goal: Cardiovascular complication will be avoided Outcome: Progressing   Problem: Activity: Goal: Risk for activity intolerance will decrease Outcome: Progressing   Problem: Nutrition: Goal: Adequate nutrition will be maintained Outcome: Completed/Met   Problem: Coping: Goal: Level of anxiety will decrease Outcome: Progressing   Problem: Elimination: Goal: Will not experience complications related to bowel motility Outcome: Progressing Goal: Will not experience complications related to urinary retention Outcome: Completed/Met   Problem: Pain Managment: Goal: General experience of comfort will improve and/or be controlled Outcome: Progressing   Problem: Safety: Goal: Ability to remain free from injury will improve Outcome: Progressing   Problem: Skin Integrity: Goal: Risk for impaired skin integrity will decrease Outcome: Adequate for Discharge   Problem: Education: Goal: Knowledge of the prescribed therapeutic regimen will improve Outcome: Progressing Goal: Understanding of discharge needs will improve Outcome: Progressing   Problem: Activity: Goal: Ability to avoid complications of mobility impairment will improve Outcome: Adequate for Discharge Goal: Ability to tolerate increased activity will  improve Outcome: Adequate for Discharge   Problem: Clinical Measurements: Goal: Postoperative complications will be avoided or minimized Outcome: Progressing   Problem: Pain Management: Goal: Pain level will decrease with appropriate interventions Outcome: Progressing   Problem: Skin Integrity: Goal: Will show signs of wound healing Outcome: Progressing

## 2023-12-12 NOTE — Progress Notes (Signed)
    2 Days Post-Op Procedure(s) (LRB): PARTIAL HIP REVISION (Left) APPLICATION, WOUND VAC (Left)  Subjective: Patient reports pain as mild.  No issues overnight. Denies distal n/t. Discussed plan for mobilization with PT today and importance of hip precautions.  Hgb 8.7 today, appears stable.   Objective:   VITALS:   Vitals:   12/11/23 1412 12/11/23 1415 12/11/23 2025 12/12/23 0531  BP: (!) 111/44 (!) 114/54 (!) 111/59 (!) 124/59  Pulse: (!) 53 (!) 53 62 72  Resp: 18  16 16   Temp: 97.9 F (36.6 C)  99.2 F (37.3 C) 98.5 F (36.9 C)  TempSrc: Oral  Oral Oral  SpO2: 100%  96% 97%  Weight:      Height:        AAOx4 resting comfortably in NAD Sensation intact distally Intact pulses distally Dorsiflexion/Plantar flexion intact Incision: dressing C/D/I Woundvac holding suction, nothing in cannister Compartment soft Wiggles toes appropriately    Lab Results  Component Value Date   WBC 5.1 12/12/2023   HGB 8.7 (L) 12/12/2023   HCT 25.4 (L) 12/12/2023   MCV 97.3 12/12/2023   PLT 103 (L) 12/12/2023   BMET    Component Value Date/Time   NA 132 (L) 12/11/2023 0339   K 4.0 12/11/2023 0339   CL 101 12/11/2023 0339   CO2 23 12/11/2023 0339   GLUCOSE 131 (H) 12/11/2023 0339   BUN 16 12/11/2023 0339   CREATININE 0.75 12/11/2023 0339   CALCIUM  8.2 (L) 12/11/2023 0339   GFRNONAA >60 12/11/2023 9528    Xray: THA compnents with constrained liner in good position no adverse features  Assessment/Plan: 2 Days Post-Op   Principal Problem:   Failed total hip arthroplasty (HCC)  S/p left hip revision to constrained liner and heterotopic bone resection 12/10/23  Post op recs: WB: WBAT LLE, posterior hip precautions Abx: ancef  in-house x 3 days discharged on cefadroxil , follow up intra-op cxs Imaging: PACU pelvis Xray Dressing: Prevena wound vac, keep intact until follow up HO prophylaxis: Toradol  and celebrex  DVT prophylaxis: Resume Eliquis  2.5 mg postop day 1 and 2 and  then resume Eliquis  5 mg postop day 3.  Resume Plavix  75 mg daily starting postop day 2 Follow up: 2 weeks after surgery for a wound check with Dr. Pryor Browning at [facility].  Address: [address], [place], [place] [address]  Office Phone: [phone]  Albertus Alt 12/12/2023, 6:50 AM     Contact information:   Weekdays 7am-5pm epic message Dr. Pryor Browning, or call office for patient follow up: [phone] After hours and holidays please check [email or web address] for group call information for [facility]

## 2023-12-12 NOTE — Discharge Summary (Signed)
 Physician Discharge Summary  Patient ID: [name] MRN: [id number] DOB/AGE: 84/02/1941 84 y.o.  Admit date: 12/10/2023 Discharge date: 12/12/2023  Admission Diagnoses:  Failed total hip arthroplasty [facility])  Discharge Diagnoses:  Principal Problem:   Failed total hip arthroplasty [facility])   Past Medical History:  Diagnosis Date   Actinic keratosis    Arthritis    Atrial fibrillation (HCC)    Basal cell carcinoma 05/28/2022   L lower ear helix, treated with [facility] 06/10/2022   Coronary artery disease    GERD (gastroesophageal reflux disease)    Headache    Hypertension    Lumbar disc disease    Reflux esophagitis     Surgeries: Procedure(s): PARTIAL HIP REVISION APPLICATION, WOUND VAC on 12/10/2023   Consultants (if any):   Discharged Condition: Improved  Hospital Course: ALEXANDER AUMENT is an 83 y.o. male who was admitted 12/10/2023 with a diagnosis of Failed total hip arthroplasty (HCC) and went to the operating room on 12/10/2023 and underwent the above named procedures.  Some mobilization difficulties after surgery.  Physical therapy recommended skilled nursing facility upon discharge, see separate note.  He was given perioperative antibiotics:  Anti-infectives (From admission, onward)    Start     Dose/Rate Route Frequency Ordered Stop   12/13/23 2200  cefadroxil (DURICEF) capsule 500 mg        500 mg Oral 2 times daily 12/10/23 1910 12/20/23 2159   12/13/23 0000  cefadroxil (DURICEF) 500 MG capsule        500 mg Oral 2 times daily 12/12/23 0655 12/20/23 2359   12/10/23 2200  ceFAZolin (ANCEF) IVPB 2g/100 mL premix        2 g 200 mL/hr over 30 Minutes Intravenous Every 8 hours 12/10/23 1910 12/13/23 2159   12/10/23 1000  ceFAZolin (ANCEF) IVPB 2g/100 mL premix        2 g 200 mL/hr over 30 Minutes Intravenous On call to O.R. 12/10/23 4098 12/10/23 1223     .  He was given sequential compression devices, early ambulation, and Plavix  + aspirin  for DVT  prophylaxis.  He benefited maximally from the hospital stay and there were no complications.    Recent vital signs:  Vitals:   12/11/23 2025 12/12/23 0531  BP: (!) 111/59 (!) 124/59  Pulse: 62 72  Resp: 16 16  Temp: 99.2 F (37.3 C) 98.5 F (36.9 C)  SpO2: 96% 97%    Recent laboratory studies:  Lab Results  Component Value Date   HGB 8.7 (L) 12/12/2023   HGB 8.3 (L) 12/11/2023   HGB 12.2 (L) 11/28/2023   Lab Results  Component Value Date   WBC 5.1 12/12/2023   PLT 103 (L) 12/12/2023   Lab Results  Component Value Date   INR 1.2 04/10/2021   Lab Results  Component Value Date   NA 132 (L) 12/11/2023   K 4.0 12/11/2023   CL 101 12/11/2023   CO2 23 12/11/2023   BUN 16 12/11/2023   CREATININE 0.75 12/11/2023   GLUCOSE 131 (H) 12/11/2023    Discharge Medications:   Allergies as of 12/12/2023       Reactions   Tramadol Other (See Comments)   Hallucination        Medication List     STOP taking these medications    acetaminophen  650 MG CR tablet Commonly known as: TYLENOL  Replaced by: acetaminophen  500 MG tablet   clopidogrel  75 MG tablet Commonly known as: PLAVIX    HYDROcodone -acetaminophen  5-325  MG tablet Commonly known as: NORCO/VICODIN       TAKE these medications    acetaminophen  500 MG tablet Commonly known as: TYLENOL  Take 2 tablets (1,000 mg total) by mouth every 8 (eight) hours as needed. Replaces: acetaminophen  650 MG CR tablet   apixaban  5 MG Tabs tablet Commonly known as: ELIQUIS  Take 1 tablet (5 mg total) by mouth 2 (two) times daily. You are to restart your Eliquis  after surgery to help reduce your risk of getting a blood clot.  For the first two days after surgery, take a half a dose (2.5 mg Eliquis ) twice a day.  Then on the third day, resume your full dose (5 mg Eliquis ) twice a day.  Take your Eliquis  for a minimum of 4 weeks from a post-operative standpoint.  Then follow the direction of your regular prescribing provider. What  changed: additional instructions   cefadroxil 500 MG capsule Commonly known as: DURICEF Take 1 capsule (500 mg total) by mouth 2 (two) times daily for 7 days. Start taking on: Dec 13, 2023   donepezil  10 MG tablet Commonly known as: ARICEPT  Take 10 mg by mouth at bedtime.   furosemide  20 MG tablet Commonly known as: LASIX  Take 20 mg by mouth daily.   losartan  25 MG tablet Commonly known as: COZAAR  Take 1 tablet (25 mg total) by mouth daily.   methocarbamol  500 MG tablet Commonly known as: ROBAXIN  Take 1 tablet (500 mg total) by mouth every 8 (eight) hours as needed for up to 10 days for muscle spasms.   metoprolol  succinate 50 MG 24 hr tablet Commonly known as: TOPROL -XL Take 1 tablet (50 mg total) by mouth daily. Take with or immediately following a meal.   ondansetron  4 MG tablet Commonly known as: Zofran  Take 1 tablet (4 mg total) by mouth every 8 (eight) hours as needed for up to 14 days for nausea or vomiting.   oxyCODONE  5 MG immediate release tablet Commonly known as: Roxicodone  Take 1 tablet (5 mg total) by mouth every 4 (four) hours as needed for up to 7 days for severe pain (pain score 7-10) or moderate pain (pain score 4-6).   oxymetazoline  0.05 % nasal spray Commonly known as: AFRIN Place 1 spray into both nostrils 2 (two) times daily as needed for congestion.   pantoprazole  40 MG tablet Commonly known as: PROTONIX  Take 40 mg by mouth daily.   polyethylene glycol 17 g packet Commonly known as: MiraLax  Take 17 g by mouth daily.   rosuvastatin  5 MG tablet Commonly known as: CRESTOR  Take 5 mg by mouth daily.   tamsulosin  0.4 MG Caps capsule Commonly known as: FLOMAX  Take 0.4 mg by mouth at bedtime.        Diagnostic Studies: DG HIP UNILAT W OR W/O PELVIS 2-3 VIEWS LEFT Result Date: 12/10/2023 CLINICAL DATA:  Postop. EXAM: DG HIP (WITH OR WITHOUT PELVIS) 2-3V LEFT COMPARISON:  None Available. FINDINGS: Left hip arthroplasty in expected alignment. No  periprosthetic lucency or fracture. Recent postsurgical change includes air and edema in the soft tissues. Heterotopic calcification adjacent to the lateral acetabulum. Wound VAC laterally. IMPRESSION: Left hip arthroplasty without immediate postoperative complication. Electronically Signed   By: Chadwick Colonel M.D.   On: 12/10/2023 17:20   DG Pelvis Portable Result Date: 12/10/2023 CLINICAL DATA:  Elective surgery.  Intra op left hip revision. EXAM: PORTABLE PELVIS 1-2 VIEWS COMPARISON:  None Available. FINDINGS: Cross-table intraoperative spot view of the pelvis submitted. There are bilateral hip arthroplasties  in place. IMPRESSION: Intraoperative spot view during elective surgery, reported left hip revision. Electronically Signed   By: Chadwick Colonel M.D.   On: 12/10/2023 15:53    Disposition:      Follow-up Information     Murleen Arms, MD Follow up in 1 week(s).   Specialty: Orthopedic Surgery Why: Dressing change, wound check, and x-rays Contact information: [address] [place] [address] [phone]                    Discharge Instructions      INSTRUCTIONS AFTER JOINT REPLACEMENT   Remove items at home which could result in a fall. This includes throw rugs or furniture in walking pathways ICE to the affected joint every three hours while awake for 30 minutes at a time, for at least the first 3-5 days, and then as needed for pain and swelling.  Continue to use ice for pain and swelling. You may notice swelling that will progress down to the foot and ankle.  This is normal after surgery.  Elevate your leg when you are not up walking on it.   Continue to use the breathing machine you got in the hospital (incentive spirometer) which will help keep your temperature down.  It is common for your temperature to cycle up and down following surgery, especially at night when you are not up moving around and exerting yourself.  The breathing machine  keeps your lungs expanded and your temperature down.  DIET:  As you were doing prior to hospitalization, we recommend a well-balanced diet.  DRESSING / WOUND CARE / SHOWERING:  Keep the surgical dressing until follow up.  This has battery powered suction to be kept at 125 mmHg and lasts 7 days.  If the battery stops sooner than 7 days, then call our office for further instructions.    The dressing is also water resistant, however it is best to shower with an extra covering, such as saran wrap to keep it dry.  IF THE DRESSING FALLS OFF or the wound gets wet inside, change the dressing with sterile gauze and call our office for further instruction.  Please use good hand washing techniques before changing the dressing.  Do not use any lotions or creams on the incision until instructed by your surgeon.     ACTIVITY  Increase activity slowly as tolerated, but follow the weight bearing instructions below.   No driving for 6 weeks or until further direction given by your physician.  You cannot drive while taking narcotics.  No lifting or carrying greater than 10 lbs. until further directed by your surgeon. Avoid periods of inactivity such as sitting longer than an hour when not asleep. This helps prevent blood clots.  You may return to work once you are authorized by your doctor.   WEIGHT BEARING: Weight bearing as tolerated with assist device (walker, cane, etc) as directed, use it as long as suggested by your surgeon or therapist, typically at least 4-6 weeks.  EXERCISES  Results after joint replacement surgery are often greatly improved when you follow the exercise, range of motion and muscle strengthening exercises prescribed by your doctor. Safety measures are also important to protect the joint from further injury. Any time any of these exercises cause you to have increased pain or swelling, decrease what you are doing until you are comfortable again and then slowly increase them. If you have  problems or questions, call your caregiver or physical  therapist for advice.   Rehabilitation is important following a joint replacement. After just a few days of immobilization, the muscles of the leg can become weakened and shrink (atrophy).  These exercises are designed to build up the tone and strength of the thigh and leg muscles and to improve motion. Often times heat used for twenty to thirty minutes before working out will loosen up your tissues and help with improving the range of motion but do not use heat for the first two weeks following surgery (sometimes heat can increase post-operative swelling).   These exercises can be done on a training (exercise) mat, on the floor, on a table or on a bed. Use whatever works the best and is most comfortable for you.    Use music or television while you are exercising so that the exercises are a pleasant break in your day. This will make your life better with the exercises acting as a break in your routine that you can look forward to.   Perform all exercises about fifteen times, three times per day or as directed.  You should exercise both the operative leg and the other leg as well.  Exercises include:   Quad Sets - Tighten up the muscle on the front of the thigh (Quad) and hold for 5-10 seconds.   Straight Leg Raises - With your knee straight (if you were given a brace, keep it on), lift the leg to 60 degrees, hold for 3 seconds, and slowly lower the leg.  Perform this exercise against resistance later as your leg gets stronger.  Leg Slides: Lying on your back, slowly slide your foot toward your buttocks, bending your knee up off the floor (only go as far as is comfortable). Then slowly slide your foot back down until your leg is flat on the floor again.  Angel Wings: Lying on your back spread your legs to the side as far apart as you can without causing discomfort.  Hamstring Strength:  Lying on your back, push your heel against the floor with your  leg straight by tightening up the muscles of your buttocks.  Repeat, but this time bend your knee to a comfortable angle, and push your heel against the floor.  You may put a pillow under the heel to make it more comfortable if necessary.   A rehabilitation program following joint replacement surgery can speed recovery and prevent re-injury in the future due to weakened muscles. Contact your doctor or a physical therapist for more information on knee rehabilitation.   CONSTIPATION:  Constipation is defined medically as fewer than three stools per week and severe constipation as less than one stool per week.  Even if you have a regular bowel pattern at home, your normal regimen is likely to be disrupted due to multiple reasons following surgery.  Combination of anesthesia, postoperative narcotics, change in appetite and fluid intake all can affect your bowels.   YOU MUST use at least one of the following options; they are listed in order of increasing strength to get the job done.  They are all available over the counter, and you may need to use some, POSSIBLY even all of these options:    Drink plenty of fluids (prune juice may be helpful) and high fiber foods Colace 100 mg by mouth twice a day  Senokot for constipation as directed and as needed Dulcolax (bisacodyl), take with full glass of water  Miralax  (polyethylene glycol) once or twice a day as needed.  If you have tried all these things and are unable to have a bowel movement in the first 3-4 days after surgery call either your surgeon or your primary doctor.    If you experience loose stools or diarrhea, hold the medications until you stool forms back up.  If your symptoms do not get better within 1 week or if they get worse, check with your doctor.  If you experience "the worst abdominal pain ever" or develop nausea or vomiting, please contact the office immediately for further recommendations for treatment.  ITCHING:  If you experience  itching with your medications, try taking only a single pain pill, or even half a pain pill at a time.  You can also use Benadryl over the counter for itching or also to help with sleep.   TED HOSE STOCKINGS:  Use stockings on both legs until for at least 2 weeks or as directed by physician office. They may be removed at night for sleeping.  MEDICATIONS:  See your medication summary on the "After Visit Summary" that nursing will review with you.  You may have some home medications which will be placed on hold until you complete the course of blood thinner medication.  It is important for you to complete the blood thinner medication as prescribed.  Blood clot prevention (DVT Prophylaxis): After surgery you are at an increased risk for a blood clot.  You are to resume your Eliquis  after surgery to help reduce your risk of getting a blood clot.  For the first two days after surgery, take a half a dose (2.5 mg Eliquis ) twice a day.  Then on the third day, resume your full dose (5 mg Eliquis ) twice a day.  Take your Eliquis  for a minimum of 4 weeks from a post-operative standpoint.  Then follow the direction of your regular prescribing provider.   Signs of a pulmonary embolus (blood clot in the lungs) include sudden short of breath, feeling lightheaded or dizzy, chest pain with a deep breath, rapid pulse rapid breathing.  Signs of a blood clot in your arms or legs include new unexplained swelling and cramping, warm, red or darkened skin around the painful area.  Please call the office or 911 right away if these signs or symptoms develop.  PRECAUTIONS:   If you experience chest pain or shortness of breath - call 911 immediately for transfer to the hospital emergency department.   If you develop a fever greater that 101 F, purulent drainage from wound, increased redness or drainage from wound, foul odor from the wound/dressing, or calf pain - CONTACT YOUR SURGEON.                                                    FOLLOW-UP APPOINTMENTS:  If you do not already have a post-op appointment, please call the office for an appointment to be seen by your surgeon.  Guidelines for how soon to be seen are listed in your "After Visit Summary", but are typically between 2-3 weeks after surgery.  If you have a specialized bandage, you may be told to follow up 1 week after surgery.  POST-OPERATIVE OPIOID TAPER INSTRUCTIONS: It is important to wean off of your opioid medication as soon as possible. If you do not need pain medication after your surgery it is ok to stop day one.  Opioids include: Codeine, Hydrocodone (Norco, Vicodin), Oxycodone (Percocet, oxycontin ) and hydromorphone  amongst others.  Long term and even short term use of opiods can cause: Increased pain response Dependence Constipation Depression Respiratory depression And more.  Withdrawal symptoms can include Flu like symptoms Nausea, vomiting And more Techniques to manage these symptoms Hydrate well Eat regular healthy meals Stay active Use relaxation techniques(deep breathing, meditating, yoga) Do Not substitute Alcohol to help with tapering If you have been on opioids for less than two weeks and do not have pain than it is ok to stop all together.  Plan to wean off of opioids This plan should start within one week post op of your joint replacement. Maintain the same interval or time between taking each dose and first decrease the dose.  Cut the total daily intake of opioids by one tablet each day Next start to increase the time between doses. The last dose that should be eliminated is the evening dose.   MAKE SURE YOU:  Understand these instructions.  Get help right away if you are not doing well or get worse.    Thank you for letting us  be a part of your medical care team.  It is a privilege we respect greatly.  We hope these instructions will help you stay on track for a fast and full recovery!           Signed: Albertus Alt 12/12/2023, 6:55 AM

## 2023-12-15 LAB — AEROBIC/ANAEROBIC CULTURE W GRAM STAIN (SURGICAL/DEEP WOUND)
Culture: NO GROWTH
Culture: NO GROWTH
Culture: NO GROWTH
Gram Stain: NONE SEEN
Gram Stain: NONE SEEN
Gram Stain: NONE SEEN

## 2023-12-17 ENCOUNTER — Encounter: Payer: Self-pay | Admitting: Student

## 2023-12-17 ENCOUNTER — Non-Acute Institutional Stay (SKILLED_NURSING_FACILITY): Payer: Self-pay | Admitting: Student

## 2023-12-17 DIAGNOSIS — Z4689 Encounter for fitting and adjustment of other specified devices: Secondary | ICD-10-CM | POA: Diagnosis not present

## 2023-12-17 DIAGNOSIS — Z96642 Presence of left artificial hip joint: Secondary | ICD-10-CM | POA: Diagnosis not present

## 2023-12-17 DIAGNOSIS — S48911D Complete traumatic amputation of right shoulder and upper arm, level unspecified, subsequent encounter: Secondary | ICD-10-CM

## 2023-12-17 DIAGNOSIS — G3184 Mild cognitive impairment, so stated: Secondary | ICD-10-CM

## 2023-12-17 DIAGNOSIS — I2510 Atherosclerotic heart disease of native coronary artery without angina pectoris: Secondary | ICD-10-CM

## 2023-12-17 DIAGNOSIS — K2100 Gastro-esophageal reflux disease with esophagitis, without bleeding: Secondary | ICD-10-CM | POA: Diagnosis not present

## 2023-12-17 DIAGNOSIS — I4891 Unspecified atrial fibrillation: Secondary | ICD-10-CM

## 2023-12-17 DIAGNOSIS — M4145 Neuromuscular scoliosis, thoracolumbar region: Secondary | ICD-10-CM | POA: Diagnosis not present

## 2023-12-17 DIAGNOSIS — I4892 Unspecified atrial flutter: Secondary | ICD-10-CM | POA: Diagnosis not present

## 2023-12-17 DIAGNOSIS — I110 Hypertensive heart disease with heart failure: Secondary | ICD-10-CM | POA: Insufficient documentation
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
## 2023-12-18 ENCOUNTER — Encounter: Payer: Self-pay | Admitting: Student

## 2023-12-18 DIAGNOSIS — T84091A Other mechanical complication of internal left hip prosthesis, initial encounter: Secondary | ICD-10-CM | POA: Diagnosis not present

## 2023-12-26 DIAGNOSIS — Z89201 Acquired absence of right upper limb, unspecified level: Secondary | ICD-10-CM | POA: Diagnosis not present

## 2023-12-26 DIAGNOSIS — I2510 Atherosclerotic heart disease of native coronary artery without angina pectoris: Secondary | ICD-10-CM | POA: Diagnosis not present

## 2023-12-26 DIAGNOSIS — Z85820 Personal history of malignant melanoma of skin: Secondary | ICD-10-CM | POA: Diagnosis not present

## 2023-12-26 DIAGNOSIS — M51369 Other intervertebral disc degeneration, lumbar region without mention of lumbar back pain or lower extremity pain: Secondary | ICD-10-CM | POA: Diagnosis not present

## 2023-12-26 DIAGNOSIS — T84061D Wear of articular bearing surface of internal prosthetic left hip joint, subsequent encounter: Secondary | ICD-10-CM | POA: Diagnosis not present

## 2023-12-26 DIAGNOSIS — I509 Heart failure, unspecified: Secondary | ICD-10-CM | POA: Diagnosis not present

## 2023-12-26 DIAGNOSIS — I4892 Unspecified atrial flutter: Secondary | ICD-10-CM | POA: Diagnosis not present

## 2023-12-26 DIAGNOSIS — I4891 Unspecified atrial fibrillation: Secondary | ICD-10-CM | POA: Diagnosis not present

## 2023-12-26 DIAGNOSIS — Z7902 Long term (current) use of antithrombotics/antiplatelets: Secondary | ICD-10-CM | POA: Diagnosis not present

## 2023-12-26 DIAGNOSIS — K2100 Gastro-esophageal reflux disease with esophagitis, without bleeding: Secondary | ICD-10-CM | POA: Diagnosis not present

## 2023-12-26 DIAGNOSIS — K5900 Constipation, unspecified: Secondary | ICD-10-CM | POA: Diagnosis not present

## 2023-12-26 DIAGNOSIS — Z7901 Long term (current) use of anticoagulants: Secondary | ICD-10-CM | POA: Diagnosis not present

## 2023-12-26 DIAGNOSIS — G3184 Mild cognitive impairment, so stated: Secondary | ICD-10-CM | POA: Diagnosis not present

## 2023-12-26 DIAGNOSIS — M4145 Neuromuscular scoliosis, thoracolumbar region: Secondary | ICD-10-CM | POA: Diagnosis not present

## 2023-12-26 DIAGNOSIS — Z955 Presence of coronary angioplasty implant and graft: Secondary | ICD-10-CM | POA: Diagnosis not present

## 2023-12-26 DIAGNOSIS — Z556 Problems related to health literacy: Secondary | ICD-10-CM | POA: Diagnosis not present

## 2023-12-26 DIAGNOSIS — Z87891 Personal history of nicotine dependence: Secondary | ICD-10-CM | POA: Diagnosis not present

## 2023-12-26 DIAGNOSIS — I110 Hypertensive heart disease with heart failure: Secondary | ICD-10-CM | POA: Diagnosis not present

## 2024-01-07 LAB — FUNGAL ORGANISM REFLEX

## 2024-01-07 LAB — FUNGUS CULTURE WITH STAIN

## 2024-01-07 LAB — FUNGUS CULTURE RESULT

## 2024-01-08 LAB — FUNGUS CULTURE RESULT

## 2024-01-08 LAB — FUNGUS CULTURE WITH STAIN

## 2024-01-08 LAB — FUNGAL ORGANISM REFLEX

## 2024-01-09 DIAGNOSIS — R5383 Other fatigue: Secondary | ICD-10-CM | POA: Diagnosis not present

## 2024-01-09 DIAGNOSIS — Z96642 Presence of left artificial hip joint: Secondary | ICD-10-CM | POA: Diagnosis not present

## 2024-01-09 DIAGNOSIS — E538 Deficiency of other specified B group vitamins: Secondary | ICD-10-CM | POA: Diagnosis not present

## 2024-01-09 DIAGNOSIS — D500 Iron deficiency anemia secondary to blood loss (chronic): Secondary | ICD-10-CM | POA: Diagnosis not present

## 2024-01-11 ENCOUNTER — Emergency Department
Admission: EM | Admit: 2024-01-11 | Discharge: 2024-01-11 | Disposition: A | Discharge status: Home / Self Care | Attending: Emergency Medicine | Admitting: Emergency Medicine

## 2024-01-11 ENCOUNTER — Other Ambulatory Visit: Payer: Self-pay

## 2024-01-11 DIAGNOSIS — R319 Hematuria, unspecified: Secondary | ICD-10-CM | POA: Diagnosis present

## 2024-01-11 DIAGNOSIS — N3001 Acute cystitis with hematuria: Secondary | ICD-10-CM | POA: Insufficient documentation

## 2024-01-11 DIAGNOSIS — Z7901 Long term (current) use of anticoagulants: Secondary | ICD-10-CM | POA: Diagnosis not present

## 2024-01-11 DIAGNOSIS — E871 Hypo-osmolality and hyponatremia: Secondary | ICD-10-CM | POA: Insufficient documentation

## 2024-01-11 DIAGNOSIS — I10 Essential (primary) hypertension: Secondary | ICD-10-CM | POA: Insufficient documentation

## 2024-01-11 DIAGNOSIS — I2510 Atherosclerotic heart disease of native coronary artery without angina pectoris: Secondary | ICD-10-CM | POA: Insufficient documentation

## 2024-01-11 DIAGNOSIS — R310 Gross hematuria: Secondary | ICD-10-CM

## 2024-01-11 LAB — BASIC METABOLIC PANEL WITH GFR
Anion gap: 12 (ref 5–15)
BUN: 21 mg/dL (ref 8–23)
CO2: 22 mmol/L (ref 22–32)
Calcium: 8.7 mg/dL — ABNORMAL LOW (ref 8.9–10.3)
Chloride: 100 mmol/L (ref 98–111)
Creatinine, Ser: 0.96 mg/dL (ref 0.61–1.24)
GFR, Estimated: >60 mL/min (ref >60)
Glucose, Bld: 120 mg/dL — ABNORMAL HIGH (ref 70–99)
Potassium: 3.5 mmol/L (ref 3.5–5.1)
Sodium: 134 mmol/L — ABNORMAL LOW (ref 135–145)

## 2024-01-11 LAB — URINALYSIS, ROUTINE W REFLEX MICROSCOPIC
Bilirubin Urine: NEGATIVE
Glucose, UA: NEGATIVE mg/dL
Ketones, ur: NEGATIVE mg/dL
Nitrite: POSITIVE — AB
Protein, ur: NEGATIVE mg/dL
RBC / HPF: >50 RBC/hpf (ref 0–5)
Specific Gravity, Urine: 1.009 (ref 1.005–1.030)
pH: 6 (ref 5.0–8.0)

## 2024-01-11 LAB — CBC
HCT: 29.8 % — ABNORMAL LOW (ref 39.0–52.0)
Hemoglobin: 9.9 g/dL — ABNORMAL LOW (ref 13.0–17.0)
MCH: 31.6 pg (ref 26.0–34.0)
MCHC: 33.2 g/dL (ref 30.0–36.0)
MCV: 95.2 fL (ref 80.0–100.0)
Platelets: 117 10*3/uL — ABNORMAL LOW (ref 150–400)
RBC: 3.13 MIL/uL — ABNORMAL LOW (ref 4.22–5.81)
RDW: 12.9 % (ref 11.5–15.5)
WBC: 4.9 10*3/uL (ref 4.0–10.5)
nRBC: 0 % (ref 0.0–0.2)

## 2024-01-11 NOTE — ED Triage Notes (Signed)
 Pt comes with c/o hematuria. Pt states he went to his pcp on Friday and was dx with kidney infection. Pt started on meds for this. Pt states this morning he noticed blood in urine.   Pt did also have surgery on his left hip for replacement in May 21.  Pt states some pain and burning. Pt is on eliquis  and Plavix .

## 2024-01-11 NOTE — ED Provider Notes (Signed)
 [facility] Provider Note    Event Date/Time   First MD Initiated Contact with Patient 01/11/24 [phone]     (approximate)   History   Chief Complaint Hematuria   HPI  [name] is a 83 y.o. male with past medical history of hypertension, hyperlipidemia, CAD, atrial fibrillation on Eliquis , and GERD who presents to the ED complaining of hematuria.  Patient reports that he initially began feeling unwell 2 days ago with some dysuria, was seen by his PCP at that time and started on Keflex for UTI.  He states that the dysuria has improved since taking his first dose of antibiotics last night, but he noticed some blood in his urine after waking up this morning.  He states that there was a significant amount of blood when he first went to urinate, but he has noticed less blood when he urinated after arrival.  He denies any fevers, nausea, vomiting, abdominal pain, or flank pain.  He has taken 2 total doses of the antibiotic thus far.     Physical Exam   Triage Vital Signs: ED Triage Vitals [01/11/24 0940]  Encounter Vitals Group     BP 112/74     Girls Systolic BP Percentile      Girls Diastolic BP Percentile      Boys Systolic BP Percentile      Boys Diastolic BP Percentile      Pulse Rate 79     Resp 18     Temp 98 F (36.7 C)     Temp src      SpO2 100 %     Weight      Height      Head Circumference      Peak Flow      Pain Score 4     Pain Loc      Pain Education      Exclude from Growth Chart     Most recent vital signs: Vitals:   01/11/24 0940  BP: 112/74  Pulse: 79  Resp: 18  Temp: 98 F (36.7 C)  SpO2: 100%    Constitutional: Alert and oriented. Eyes: Conjunctivae are normal. Head: Atraumatic. Nose: No congestion/rhinnorhea. Mouth/Throat: Mucous membranes are moist.  Cardiovascular: Normal rate, regular rhythm. Grossly normal heart sounds.  2+ radial pulses bilaterally. Respiratory: Normal respiratory effort.  No  retractions. Lungs CTAB. Gastrointestinal: Soft and nontender.  No CVA tenderness bilaterally.  No distention. Musculoskeletal: No lower extremity tenderness nor edema.  Neurologic:  Normal speech and language. No gross focal neurologic deficits are appreciated.    ED Results / Procedures / Treatments   Labs (all labs ordered are listed, but only abnormal results are displayed) Labs Reviewed  URINALYSIS, ROUTINE W REFLEX MICROSCOPIC - Abnormal; Notable for the following components:      Result Value   Color, Urine AMBER (*)    APPearance CLEAR (*)    Hgb urine dipstick LARGE (*)    Nitrite POSITIVE (*)    Leukocytes,Ua TRACE (*)    Bacteria, UA RARE (*)    All other components within normal limits  BASIC METABOLIC PANEL WITH GFR - Abnormal; Notable for the following components:   Sodium 134 (*)    Glucose, Bld 120 (*)    Calcium  8.7 (*)    All other components within normal limits  CBC - Abnormal; Notable for the following components:   RBC 3.13 (*)    Hemoglobin 9.9 (*)    HCT  29.8 (*)    Platelets 117 (*)    All other components within normal limits  URINE CULTURE    PROCEDURES:  Critical Care performed: No  Procedures   MEDICATIONS ORDERED IN ED: Medications - No data to display   IMPRESSION / MDM / ASSESSMENT AND PLAN / ED COURSE  I reviewed the triage vital signs and the nursing notes.                              83 y.o. male with past medical history of hypertension, hyperlipidemia, CAD, atrial fibrillation on Eliquis , and GERD who presents to the ED complaining of hematuria this morning after starting antibiotics for UTI yesterday.  Patient's presentation is most consistent with acute presentation with potential threat to life or bodily function.  Differential diagnosis includes, but is not limited to, gross hematuria, urinary retention, cystitis, pyelonephritis, kidney stone.  Patient nontoxic-appearing and in no acute distress, vital signs are  unremarkable.  He has a benign abdominal exam and no CVA tenderness to suggest pyelonephritis, vital signs do not appear concerning for sepsis.  He does describe improving hematuria since onset earlier this morning, no findings to suggest urinary retention.  Will screen labs and urinalysis, reassess following results.  Labs are reassuring with no significant anemia, leukocytosis, electrolyte abnormality, or AKI.  Urinalysis concerning for infection but patient just darted antibiotics for this last night and do not feel this represents treatment failure.  Hematuria is relatively mild at this time and he is appropriate for ongoing course of Keflex with PCP follow-up.  Urine was sent for culture and he was counseled to return to the ED for new or worsening symptoms, patient agrees with plan.      FINAL CLINICAL IMPRESSION(S) / ED DIAGNOSES   Final diagnoses:  Gross hematuria  Acute cystitis with hematuria     Rx / DC Orders   ED Discharge Orders     None        Note:  This document was prepared using Dragon voice recognition software and may include unintentional dictation errors.   Willo Dunnings, MD 01/11/24 [phone]

## 2024-01-12 LAB — URINE CULTURE: Culture: NO GROWTH

## 2024-01-19 DIAGNOSIS — N3090 Cystitis, unspecified without hematuria: Secondary | ICD-10-CM | POA: Diagnosis not present

## 2024-01-20 DIAGNOSIS — R5383 Other fatigue: Secondary | ICD-10-CM | POA: Diagnosis not present

## 2024-01-20 DIAGNOSIS — R001 Bradycardia, unspecified: Secondary | ICD-10-CM | POA: Diagnosis not present

## 2024-01-22 DIAGNOSIS — T84091D Other mechanical complication of internal left hip prosthesis, subsequent encounter: Secondary | ICD-10-CM | POA: Diagnosis not present

## 2024-02-06 ENCOUNTER — Other Ambulatory Visit
Admission: RE | Admit: 2024-02-06 | Discharge: 2024-02-06 | Disposition: A | Discharge status: Home / Self Care | Source: Ambulatory Visit | Attending: Internal Medicine | Admitting: Internal Medicine

## 2024-02-06 ENCOUNTER — Other Ambulatory Visit: Payer: Self-pay | Admitting: Internal Medicine

## 2024-02-06 ENCOUNTER — Other Ambulatory Visit

## 2024-02-06 ENCOUNTER — Ambulatory Visit
Admission: RE | Admit: 2024-02-06 | Discharge: 2024-02-06 | Disposition: A | Discharge status: Home / Self Care | Source: Ambulatory Visit | Attending: Internal Medicine | Admitting: Internal Medicine

## 2024-02-06 DIAGNOSIS — I10 Essential (primary) hypertension: Secondary | ICD-10-CM | POA: Diagnosis not present

## 2024-02-06 DIAGNOSIS — R0602 Shortness of breath: Secondary | ICD-10-CM | POA: Diagnosis not present

## 2024-02-06 DIAGNOSIS — R600 Localized edema: Secondary | ICD-10-CM | POA: Diagnosis not present

## 2024-02-06 DIAGNOSIS — I4891 Unspecified atrial fibrillation: Secondary | ICD-10-CM | POA: Insufficient documentation

## 2024-02-06 DIAGNOSIS — I2510 Atherosclerotic heart disease of native coronary artery without angina pectoris: Secondary | ICD-10-CM | POA: Diagnosis not present

## 2024-02-06 DIAGNOSIS — I252 Old myocardial infarction: Secondary | ICD-10-CM | POA: Diagnosis not present

## 2024-02-06 DIAGNOSIS — E782 Mixed hyperlipidemia: Secondary | ICD-10-CM | POA: Diagnosis not present

## 2024-02-06 DIAGNOSIS — D1809 Hemangioma of other sites: Secondary | ICD-10-CM | POA: Diagnosis not present

## 2024-02-06 DIAGNOSIS — K219 Gastro-esophageal reflux disease without esophagitis: Secondary | ICD-10-CM | POA: Diagnosis not present

## 2024-02-06 DIAGNOSIS — I4892 Unspecified atrial flutter: Secondary | ICD-10-CM | POA: Diagnosis not present

## 2024-02-06 DIAGNOSIS — Z955 Presence of coronary angioplasty implant and graft: Secondary | ICD-10-CM | POA: Diagnosis not present

## 2024-02-06 LAB — D-DIMER, QUANTITATIVE: D-Dimer, Quant: 0.67 ug{FEU}/mL — ABNORMAL HIGH (ref 0.00–0.50)

## 2024-02-09 DIAGNOSIS — N3090 Cystitis, unspecified without hematuria: Secondary | ICD-10-CM | POA: Diagnosis not present

## 2024-02-16 DIAGNOSIS — D500 Iron deficiency anemia secondary to blood loss (chronic): Secondary | ICD-10-CM | POA: Diagnosis not present

## 2024-02-16 DIAGNOSIS — E538 Deficiency of other specified B group vitamins: Secondary | ICD-10-CM | POA: Diagnosis not present

## 2024-02-16 DIAGNOSIS — R5383 Other fatigue: Secondary | ICD-10-CM | POA: Diagnosis not present

## 2024-02-18 ENCOUNTER — Other Ambulatory Visit: Payer: Self-pay | Admitting: Orthopedic Surgery

## 2024-02-18 DIAGNOSIS — Z96649 Presence of unspecified artificial hip joint: Secondary | ICD-10-CM

## 2024-02-23 DIAGNOSIS — I4891 Unspecified atrial fibrillation: Secondary | ICD-10-CM | POA: Diagnosis not present

## 2024-02-23 NOTE — Progress Notes (Signed)
 Patient is here for an EKG check.  Meade, CMA preformed the EKG.  I showed Dr. Florencio who wanted to get him consented for a cardioversion and set up to see an EP provider here in the office.   Patient has been consented and scheduled with EP 03/10/2024.   Made patient aware I could call to schedule cardioversion as soon as I got the approval from insurance.

## 2024-02-24 ENCOUNTER — Ambulatory Visit
Admission: RE | Admit: 2024-02-24 | Discharge: 2024-02-24 | Disposition: A | Discharge status: Home / Self Care | Source: Ambulatory Visit | Attending: Orthopedic Surgery | Admitting: Orthopedic Surgery

## 2024-02-24 DIAGNOSIS — M25552 Pain in left hip: Secondary | ICD-10-CM | POA: Diagnosis not present

## 2024-02-24 DIAGNOSIS — Z96649 Presence of unspecified artificial hip joint: Secondary | ICD-10-CM

## 2024-02-24 DIAGNOSIS — Z96642 Presence of left artificial hip joint: Secondary | ICD-10-CM | POA: Diagnosis not present

## 2024-03-02 ENCOUNTER — Encounter
Admission: RE | Disposition: A | Discharge status: Home / Self Care | Payer: Self-pay | Source: Home / Self Care | Attending: Cardiology

## 2024-03-02 ENCOUNTER — Ambulatory Visit
Admission: RE | Admit: 2024-03-02 | Discharge: 2024-03-02 | Disposition: A | Discharge status: Home / Self Care | Attending: Cardiology | Admitting: Cardiology

## 2024-03-02 ENCOUNTER — Ambulatory Visit: Admitting: Certified Registered"

## 2024-03-02 ENCOUNTER — Encounter: Payer: Self-pay | Admitting: Cardiology

## 2024-03-02 ENCOUNTER — Other Ambulatory Visit: Payer: Self-pay

## 2024-03-02 DIAGNOSIS — I2510 Atherosclerotic heart disease of native coronary artery without angina pectoris: Secondary | ICD-10-CM | POA: Diagnosis not present

## 2024-03-02 DIAGNOSIS — Z87891 Personal history of nicotine dependence: Secondary | ICD-10-CM | POA: Diagnosis not present

## 2024-03-02 DIAGNOSIS — I10 Essential (primary) hypertension: Secondary | ICD-10-CM | POA: Insufficient documentation

## 2024-03-02 DIAGNOSIS — I4891 Unspecified atrial fibrillation: Secondary | ICD-10-CM | POA: Diagnosis not present

## 2024-03-02 DIAGNOSIS — R001 Bradycardia, unspecified: Secondary | ICD-10-CM | POA: Diagnosis not present

## 2024-03-02 DIAGNOSIS — Z7901 Long term (current) use of anticoagulants: Secondary | ICD-10-CM | POA: Insufficient documentation

## 2024-03-02 DIAGNOSIS — I4892 Unspecified atrial flutter: Secondary | ICD-10-CM

## 2024-03-02 HISTORY — PX: CARDIOVERSION: SHX1299

## 2024-03-02 SURGERY — CARDIOVERSION
Anesthesia: General

## 2024-03-02 MED ORDER — PROPOFOL 10 MG/ML IV BOLUS
INTRAVENOUS | Status: DC | PRN
  Administered 2024-03-02 (×2): 70 mg via INTRAVENOUS

## 2024-03-02 MED ORDER — LIDOCAINE HCL (CARDIAC) PF 100 MG/5ML IV SOSY
PREFILLED_SYRINGE | INTRAVENOUS | Status: DC | PRN
Start: 2024-03-02 — End: 2024-03-02
  Administered 2024-03-02 (×2): 100 mg via INTRATRACHEAL

## 2024-03-02 MED ORDER — SODIUM CHLORIDE 0.9 % IV SOLN: INTRAVENOUS | Status: DC

## 2024-03-02 NOTE — Procedures (Signed)
 Electrical Cardioversion Procedure Note  Indication: Atrial Fibrillation  Procedure Details: Consent: Indication, Risk/benefits of procedure as well as the alternatives explained to patient and informed consent obtained. Time out performed. Verified patient identification, verified procedure, verified correct patient position, special equipment/implants available, medications/allergies/relevent history reviewed, required imaging and test results reviewed.  Deep sedation was provided by anesthesia with propofol . Patient was delivered with 200 Joules of electricity X 1 with success to Sinus rhythm. Patient tolerated the procedure well. No immediate complication noted.   Successful cardioversion  Bradley Paterson, MD [facility] Cardiology- [facility]

## 2024-03-02 NOTE — Transfer of Care (Signed)
 Immediate Anesthesia Transfer of Care Note  Patient: [name]  Procedure(s) Performed: CARDIOVERSION  Patient Location: Cath Lab  Anesthesia Type:General  Level of Consciousness: drowsy and patient cooperative  Airway & Oxygen Therapy: Patient Spontanous Breathing and Patient connected to nasal cannula oxygen  Post-op Assessment: Report given to RN, Post -op Vital signs reviewed and stable, and Patient moving all extremities X 4  Post vital signs: Reviewed and stable  Last Vitals:  Vitals Value Taken Time  BP    Temp    Pulse    Resp 17 03/02/24 12:54  SpO2    Vitals shown include unfiled device data.  Last Pain:  Vitals:   03/02/24 1227  TempSrc: Temporal  PainSc: 0-No pain         Complications: No notable events documented.

## 2024-03-02 NOTE — Anesthesia Preprocedure Evaluation (Addendum)
 Anesthesia Evaluation  Patient identified by MRN, date of birth, ID band Patient awake    Reviewed: Allergy & Precautions, NPO status , Patient's Chart, lab work & pertinent test results  History of Anesthesia Complications Negative for: history of anesthetic complications  Airway Mallampati: IV   Neck ROM: Full    Dental  (+) Upper Dentures, Missing   Pulmonary former smoker (quit 1960s)   Pulmonary exam normal breath sounds clear to auscultation       Cardiovascular hypertension, + CAD (s/p MI and stents)  + dysrhythmias (a fib on Eliquis )  Rhythm:Irregular Rate:Normal     Neuro/Psych  Headaches    GI/Hepatic ,GERD  ,,  Endo/Other  negative endocrine ROS    Renal/GU negative Renal ROS     Musculoskeletal  (+) Arthritis ,    Abdominal   Peds  Hematology negative hematology ROS (+)   Anesthesia Other Findings   Reproductive/Obstetrics                              Anesthesia Physical Anesthesia Plan  ASA: 3  Anesthesia Plan: General   Post-op Pain Management:    Induction: Intravenous  PONV Risk Score and Plan: 2 and Propofol  infusion, TIVA and Treatment may vary due to age or medical condition  Airway Management Planned: Natural Airway  Additional Equipment:   Intra-op Plan:   Post-operative Plan:   Informed Consent: I have reviewed the patients History and Physical, chart, labs and discussed the procedure including the risks, benefits and alternatives for the proposed anesthesia with the patient or authorized representative who has indicated his/her understanding and acceptance.       Plan Discussed with: CRNA  Anesthesia Plan Comments: (LMA/GETA backup discussed.  Patient consented for risks of anesthesia including but not limited to:  - adverse reactions to medications - damage to eyes, teeth, lips or other oral mucosa - nerve damage due to positioning  - sore  throat or hoarseness - damage to heart, brain, nerves, lungs, other parts of body or loss of life  Informed patient about role of CRNA in peri- and intra-operative care.  Patient voiced understanding.)         Anesthesia Quick Evaluation

## 2024-03-03 ENCOUNTER — Encounter: Payer: Self-pay | Admitting: Cardiology

## 2024-03-11 DIAGNOSIS — M545 Low back pain, unspecified: Secondary | ICD-10-CM | POA: Diagnosis not present

## 2024-03-11 DIAGNOSIS — M25552 Pain in left hip: Secondary | ICD-10-CM | POA: Diagnosis not present

## 2024-03-16 DIAGNOSIS — M545 Low back pain, unspecified: Secondary | ICD-10-CM | POA: Diagnosis not present

## 2024-03-21 NOTE — Anesthesia Postprocedure Evaluation (Signed)
 Anesthesia Post Note  Patient: [name]  Procedure(s) Performed: CARDIOVERSION  Patient location during evaluation: Specials Recovery Anesthesia Type: General Level of consciousness: awake and alert Pain management: pain level controlled Vital Signs Assessment: post-procedure vital signs reviewed and stable Respiratory status: spontaneous breathing, nonlabored ventilation, respiratory function stable and patient connected to nasal cannula oxygen Cardiovascular status: blood pressure returned to baseline and stable Postop Assessment: no apparent nausea or vomiting Anesthetic complications: no   No notable events documented.   Last Vitals:  Vitals:   03/02/24 1350 03/02/24 1400  BP: 119/61 115/65  Pulse: (!) 49 (!) 47  Resp: 16 15  Temp:  36.4 C  SpO2: 99% 99%    Last Pain:  Vitals:   03/02/24 1400  TempSrc: Temporal  PainSc: 0-No pain                 Prentice Murphy

## 2024-04-06 DIAGNOSIS — M5416 Radiculopathy, lumbar region: Secondary | ICD-10-CM | POA: Diagnosis not present

## 2024-04-23 ENCOUNTER — Other Ambulatory Visit: Payer: Self-pay

## 2024-04-23 ENCOUNTER — Emergency Department

## 2024-04-23 ENCOUNTER — Encounter: Payer: Self-pay | Admitting: Emergency Medicine

## 2024-04-23 ENCOUNTER — Emergency Department
Admission: EM | Admit: 2024-04-23 | Discharge: 2024-04-23 | Disposition: A | Discharge status: Home / Self Care | Source: Other Acute Inpatient Hospital | Attending: Emergency Medicine | Admitting: Emergency Medicine

## 2024-04-23 DIAGNOSIS — K5733 Diverticulitis of large intestine without perforation or abscess with bleeding: Secondary | ICD-10-CM | POA: Insufficient documentation

## 2024-04-23 DIAGNOSIS — K7689 Other specified diseases of liver: Secondary | ICD-10-CM | POA: Diagnosis not present

## 2024-04-23 DIAGNOSIS — R1032 Left lower quadrant pain: Secondary | ICD-10-CM

## 2024-04-23 DIAGNOSIS — Z7901 Long term (current) use of anticoagulants: Secondary | ICD-10-CM | POA: Insufficient documentation

## 2024-04-23 DIAGNOSIS — K625 Hemorrhage of anus and rectum: Secondary | ICD-10-CM | POA: Diagnosis not present

## 2024-04-23 DIAGNOSIS — I482 Chronic atrial fibrillation, unspecified: Secondary | ICD-10-CM | POA: Insufficient documentation

## 2024-04-23 DIAGNOSIS — E876 Hypokalemia: Secondary | ICD-10-CM | POA: Insufficient documentation

## 2024-04-23 DIAGNOSIS — R1030 Lower abdominal pain, unspecified: Secondary | ICD-10-CM | POA: Diagnosis not present

## 2024-04-23 DIAGNOSIS — K5732 Diverticulitis of large intestine without perforation or abscess without bleeding: Secondary | ICD-10-CM | POA: Diagnosis not present

## 2024-04-23 DIAGNOSIS — K5750 Diverticulosis of both small and large intestine without perforation or abscess without bleeding: Secondary | ICD-10-CM | POA: Diagnosis not present

## 2024-04-23 DIAGNOSIS — N289 Disorder of kidney and ureter, unspecified: Secondary | ICD-10-CM | POA: Diagnosis not present

## 2024-04-23 LAB — COMPREHENSIVE METABOLIC PANEL WITH GFR
ALT: 11 U/L (ref 0–44)
AST: 23 U/L (ref 15–41)
Albumin: 4.2 g/dL (ref 3.5–5.0)
Alkaline Phosphatase: 40 U/L (ref 38–126)
Anion gap: 13 (ref 5–15)
BUN: 16 mg/dL (ref 8–23)
CO2: 23 mmol/L (ref 22–32)
Calcium: 8.8 mg/dL — ABNORMAL LOW (ref 8.9–10.3)
Chloride: 100 mmol/L (ref 98–111)
Creatinine, Ser: 0.97 mg/dL (ref 0.61–1.24)
GFR, Estimated: >60 mL/min (ref >60)
Glucose, Bld: 126 mg/dL — ABNORMAL HIGH (ref 70–99)
Potassium: 3.3 mmol/L — ABNORMAL LOW (ref 3.5–5.1)
Sodium: 136 mmol/L (ref 135–145)
Total Bilirubin: 0.8 mg/dL (ref 0.0–1.2)
Total Protein: 7 g/dL (ref 6.5–8.1)

## 2024-04-23 LAB — CBC
HCT: 35.3 % — ABNORMAL LOW (ref 39.0–52.0)
Hemoglobin: 11.3 g/dL — ABNORMAL LOW (ref 13.0–17.0)
MCH: 30.1 pg (ref 26.0–34.0)
MCHC: 32 g/dL (ref 30.0–36.0)
MCV: 94.1 fL (ref 80.0–100.0)
Platelets: 140 K/uL — ABNORMAL LOW (ref 150–400)
RBC: 3.75 MIL/uL — ABNORMAL LOW (ref 4.22–5.81)
RDW: 14.1 % (ref 11.5–15.5)
WBC: 5.6 K/uL (ref 4.0–10.5)
nRBC: 0 % (ref 0.0–0.2)

## 2024-04-23 LAB — PROTIME-INR
INR: 1.3 — ABNORMAL HIGH (ref 0.8–1.2)
Prothrombin Time: 16.8 s — ABNORMAL HIGH (ref 11.4–15.2)

## 2024-04-23 LAB — TYPE AND SCREEN
ABO/RH(D): A NEG
Antibody Screen: NEGATIVE

## 2024-04-23 MED ORDER — AMOXICILLIN-POT CLAVULANATE 875-125 MG PO TABS
1.0000 | ORAL_TABLET | Freq: Once | ORAL | Status: AC
Start: 2024-04-23 — End: 2024-04-23
  Administered 2024-04-23: 1 via ORAL
  Filled 2024-04-23: qty 1

## 2024-04-23 MED ORDER — AMOXICILLIN-POT CLAVULANATE 875-125 MG PO TABS: 1.0000 | ORAL_TABLET | Freq: Two times a day (BID) | ORAL | 0 refills | Status: AC

## 2024-04-23 MED ORDER — IOHEXOL 300 MG/ML  SOLN
100.0000 mL | Freq: Once | INTRAMUSCULAR | Status: AC | PRN
  Administered 2024-04-23: 100 mL via INTRAVENOUS

## 2024-04-23 NOTE — ED Triage Notes (Signed)
 First nurse note: pt to ED from [facility] for rectal bleeding started this am. +LLQ pain. +hemorrhoids.

## 2024-04-23 NOTE — Discharge Instructions (Addendum)
 Augmentin  antibiotics twice daily for 5 days due to signs of diverticulitis  Continue all other medications  Return to the ED with any worsening symptoms, uncontrolled pain, severe bleeding or other concerns

## 2024-04-23 NOTE — ED Triage Notes (Signed)
 Pt reports he had to go to bathroom multiple times overnight. Pt then noticed bright red blood in his underwear. Reports soreness LLQ. Pt on Plavix  and eliquis .

## 2024-04-23 NOTE — ED Provider Notes (Signed)
 [facility] Provider Note    Event Date/Time   First MD Initiated Contact with Patient 04/23/24 1507     (approximate)   History   No chief complaint on file.   HPI  [name] is a 83 y.o. male who presents to the ED for evaluation of No chief complaint on file.   I reviewed PCP visit from 7/28.  History of A-fib on Eliquis  and amiodarone, Plavix .  Documented history of diverticulosis but I see no colonoscopy reports in our system. Most recent CBC, 7/18, hemoglobin of 11.6.  May and June, hemoglobin 8-9.  Patient presents to the ED alongside his wife for evaluation of multiple looser stools over the past 12 hours, blood in his underwear, soreness to his LLQ abdomen.  Does not think any of his stools have been bloody, reports small-volume, soft and brown.  Before he showered this morning noticed small-volume red blood in his underwear.  No fevers, emesis or urinary changes.  Physical Exam   Triage Vital Signs: ED Triage Vitals  Encounter Vitals Group     BP 04/23/24 1347 (!) 154/87     Girls Systolic BP Percentile --      Girls Diastolic BP Percentile --      Boys Systolic BP Percentile --      Boys Diastolic BP Percentile --      Pulse Rate 04/23/24 1347 82     Resp 04/23/24 1347 18     Temp 04/23/24 1347 98.2 F (36.8 C)     Temp Source 04/23/24 1347 Oral     SpO2 04/23/24 1347 100 %     Weight 04/23/24 1348 204 lb (92.5 kg)     Height 04/23/24 1348 6' 1 (1.854 m)     Head Circumference --      Peak Flow --      Pain Score 04/23/24 1348 0     Pain Loc --      Pain Education --      Exclude from Growth Chart --     Most recent vital signs: Vitals:   04/23/24 1347  BP: (!) 154/87  Pulse: 82  Resp: 18  Temp: 98.2 F (36.8 C)  SpO2: 100%    General: Awake, no distress.  CV:  Good peripheral perfusion.  Resp:  Normal effort.  Abd:  No distention.  Mild LLQ tenderness without peritoneal features.  Abdomen otherwise  nontender MSK:  No deformity noted.  Neuro:  No focal deficits appreciated. Other:     ED Results / Procedures / Treatments   Labs (all labs ordered are listed, but only abnormal results are displayed) Labs Reviewed  COMPREHENSIVE METABOLIC PANEL WITH GFR - Abnormal; Notable for the following components:      Result Value   Potassium 3.3 (*)    Glucose, Bld 126 (*)    Calcium  8.8 (*)    All other components within normal limits  CBC - Abnormal; Notable for the following components:   RBC 3.75 (*)    Hemoglobin 11.3 (*)    HCT 35.3 (*)    Platelets 140 (*)    All other components within normal limits  PROTIME-INR - Abnormal; Notable for the following components:   Prothrombin Time 16.8 (*)    INR 1.3 (*)    All other components within normal limits  POC OCCULT BLOOD, ED  TYPE AND SCREEN    EKG   RADIOLOGY CT abdomen/pelvis interpreted by me with signs  of diverticulitis without complicating features.  Official radiology report(s): CT ABDOMEN PELVIS W CONTRAST Result Date: 04/23/2024 EXAM: CT ABDOMEN AND PELVIS WITH CONTRAST 04/23/2024 03:43:12 PM TECHNIQUE: CT of the abdomen and pelvis was performed with the administration of 100 mL of iohexol  (OMNIPAQUE ) 300 MG/ML solution. Multiplanar reformatted images are provided for review. Automated exposure control, iterative reconstruction, and/or weight-based adjustment of the mA/kV was utilized to reduce the radiation dose to as low as reasonably achievable. COMPARISON: Prior study dated 06/06/2023. CLINICAL HISTORY: Suspect diverticulitis, LLQ pain, stool changes, hematochezia. FINDINGS: LOWER CHEST: Trace right pleural effusion. Coronary and aortic atherosclerosis. Mild aortic valve calcifications. Mild cardiomegaly. LIVER: Stable hypodense hepatic lesions favoring cysts. No further imaging follow up of these lesions is indicated. GALLBLADDER AND BILE DUCTS: Prior cholecystectomy. No biliary ductal dilatation. SPLEEN: No acute  abnormality. PANCREAS: No acute abnormality. ADRENAL GLANDS: No acute abnormality. KIDNEYS, URETERS AND BLADDER: Subcentimeter left mid kidney hypodense lesion is technically too small to characterize although statistically likely to be a benign cyst. No further imaging workup of this lesion is indicated. Subcentimeter exophytic hypodense lesion from the right mid to lower kidney posteriorly on image 43 series 2 is too small to characterize but with questionable peripheral accentuated density suggesting complexity. Unchanged since earliest available comparison of 06/06/2023. This could be followed with surveillance imaging in 1 to 2 years time or dedicated renal protocol MRI with and without contrast. No stones in the kidneys or ureters. No hydronephrosis. No perinephric or periureteral stranding. Urinary bladder is partially obscured by streak artifact from the hip implants. GI AND BOWEL: Stomach demonstrates no acute abnormality. Small periampullary duodenal diverticulum. Sigmoid colon diverticulosis. Mild inflammatory stranding along the margin of the sigmoid colon on image 39 series 5 suspicious for mild acute diverticulitis. No extraluminal gas or abscess. 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 There is no bowel obstruction. PERITONEUM AND RETROPERITONEUM: No ascites. No free air. VASCULATURE: Aorta is normal in caliber. Systemic atherosclerosis is present, including the aorta and iliac arteries. LYMPH NODES: No lymphadenopathy. REPRODUCTIVE ORGANS: Prostate gland partially obscured by streak artifact from the hip implants. BONES AND SOFT TISSUES: Bilateral total hip prostheses. Heterotopic ossification along the left trochanter and along the left lateral iliac bone. Diffuse muscular atrophy suggesting sarcopenia. Thoracolumbar spondylosis and degenerative disc disease. Grade 1 degenerative retrolisthesis at L4-L5 and grade 1 degenerative  anterolisthesis at L5-S1. Mild dextroconvex lumbar scoliosis. No acute osseous abnormality. No focal soft tissue abnormality. IMPRESSION: 1. Mild acute sigmoid diverticulitis. No extraluminal gas or abscess. 2. 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 3. Subcentimeter exophytic right mid-to-lower renal lesion with questionable complexity, unchanged since 06/06/23. Recommend surveillance in 12 years or dedicated renal protocol MRI with and without contrast. 4. Systemic atherosclerosis, including the aorta and iliac arteries. 5. Thoracolumbar spondylosis and degenerative disc disease with grade 1 degenerative retrolisthesis at L4-5 and grade 1 degenerative anterolisthesis at L5-S1. Mild dextroconvex lumbar scoliosis. Electronically signed by: Ryan Salvage MD 04/23/2024 04:49 PM EDT RP Workstation: [id number]    PROCEDURES and INTERVENTIONS:  Procedures  Medications  amoxicillin -clavulanate (AUGMENTIN ) 875-125 MG per tablet 1 tablet (has no administration in time range)  iohexol  (OMNIPAQUE ) 300 MG/ML solution 100 mL (100 mLs Intravenous Contrast Given 04/23/24 1531)     IMPRESSION / MDM / ASSESSMENT AND PLAN / ED COURSE  I reviewed the triage vital signs and the nursing notes.  Differential diagnosis includes, but is not limited to, colitis, diverticulitis, hemorrhoidal bleeding,  diverticular bleeding, hematuria, blood loss anemia  {Patient presents with symptoms of an acute illness or injury that is potentially life-threatening.  Patient on Eliquis  and Plavix  presents with blood in his underwear, LLQ soreness and stool changes most likely diverticulitis.  Hemoglobin at baseline, no leukocytosis or signs of systemic illness or sepsis.  No significant metabolic derangements.  Reassuring vital signs and overall exam.  We will obtain CT and reassess.  Clinical Course as of 04/23/24 1657  Fri Apr 23, 2024  1656 Reassessed and updated patient of diverticulitis.  We  discussed a few days of antibiotics, fiber, fluids, GI follow-up, ED return precautions. [DS]    Clinical Course User Index [DS] Claudene Rover, MD     FINAL CLINICAL IMPRESSION(S) / ED DIAGNOSES   Final diagnoses:  LLQ abdominal pain  Diverticulitis of large intestine without perforation or abscess with bleeding     Rx / DC Orders   ED Discharge Orders          Ordered    amoxicillin -clavulanate (AUGMENTIN ) 875-125 MG tablet  2 times daily        04/23/24 1653             Note:  This document was prepared using Dragon voice recognition software and may include unintentional dictation errors.   Claudene Rover, MD 04/23/24 [phone]

## 2024-04-29 DIAGNOSIS — I4891 Unspecified atrial fibrillation: Secondary | ICD-10-CM | POA: Diagnosis not present

## 2024-04-29 DIAGNOSIS — M48062 Spinal stenosis, lumbar region with neurogenic claudication: Secondary | ICD-10-CM | POA: Diagnosis not present

## 2024-04-29 DIAGNOSIS — K5792 Diverticulitis of intestine, part unspecified, without perforation or abscess without bleeding: Secondary | ICD-10-CM | POA: Diagnosis not present

## 2024-04-29 DIAGNOSIS — N2889 Other specified disorders of kidney and ureter: Secondary | ICD-10-CM | POA: Diagnosis not present

## 2024-04-29 DIAGNOSIS — I4892 Unspecified atrial flutter: Secondary | ICD-10-CM | POA: Diagnosis not present

## 2024-05-10 DIAGNOSIS — M5416 Radiculopathy, lumbar region: Secondary | ICD-10-CM | POA: Diagnosis not present

## 2024-05-10 DIAGNOSIS — M47896 Other spondylosis, lumbar region: Secondary | ICD-10-CM | POA: Diagnosis not present

## 2024-06-01 DIAGNOSIS — M5416 Radiculopathy, lumbar region: Secondary | ICD-10-CM | POA: Diagnosis not present

## 2024-06-01 DIAGNOSIS — M533 Sacrococcygeal disorders, not elsewhere classified: Secondary | ICD-10-CM | POA: Diagnosis not present

## 2024-06-07 DIAGNOSIS — R972 Elevated prostate specific antigen [PSA]: Secondary | ICD-10-CM | POA: Diagnosis not present

## 2024-06-07 DIAGNOSIS — E782 Mixed hyperlipidemia: Secondary | ICD-10-CM | POA: Diagnosis not present

## 2024-06-07 DIAGNOSIS — R739 Hyperglycemia, unspecified: Secondary | ICD-10-CM | POA: Diagnosis not present

## 2024-06-07 DIAGNOSIS — E538 Deficiency of other specified B group vitamins: Secondary | ICD-10-CM | POA: Diagnosis not present

## 2024-06-11 DIAGNOSIS — R269 Unspecified abnormalities of gait and mobility: Secondary | ICD-10-CM | POA: Diagnosis not present

## 2024-06-11 DIAGNOSIS — M25552 Pain in left hip: Secondary | ICD-10-CM | POA: Diagnosis not present

## 2024-06-15 DIAGNOSIS — M25552 Pain in left hip: Secondary | ICD-10-CM | POA: Diagnosis not present

## 2024-06-15 DIAGNOSIS — R269 Unspecified abnormalities of gait and mobility: Secondary | ICD-10-CM | POA: Diagnosis not present

## 2024-06-16 DIAGNOSIS — Z125 Encounter for screening for malignant neoplasm of prostate: Secondary | ICD-10-CM | POA: Diagnosis not present

## 2024-06-16 DIAGNOSIS — M519 Unspecified thoracic, thoracolumbar and lumbosacral intervertebral disc disorder: Secondary | ICD-10-CM | POA: Diagnosis not present

## 2024-06-16 DIAGNOSIS — Z79891 Long term (current) use of opiate analgesic: Secondary | ICD-10-CM | POA: Diagnosis not present

## 2024-06-16 DIAGNOSIS — I4892 Unspecified atrial flutter: Secondary | ICD-10-CM | POA: Diagnosis not present

## 2024-06-16 DIAGNOSIS — E538 Deficiency of other specified B group vitamins: Secondary | ICD-10-CM | POA: Diagnosis not present

## 2024-06-16 DIAGNOSIS — I4891 Unspecified atrial fibrillation: Secondary | ICD-10-CM | POA: Diagnosis not present

## 2024-06-16 DIAGNOSIS — D61818 Other pancytopenia: Secondary | ICD-10-CM | POA: Insufficient documentation

## 2024-06-16 DIAGNOSIS — E782 Mixed hyperlipidemia: Secondary | ICD-10-CM | POA: Diagnosis not present

## 2024-06-21 DIAGNOSIS — M25552 Pain in left hip: Secondary | ICD-10-CM | POA: Diagnosis not present

## 2024-06-21 DIAGNOSIS — R269 Unspecified abnormalities of gait and mobility: Secondary | ICD-10-CM | POA: Diagnosis not present

## 2024-06-23 ENCOUNTER — Ambulatory Visit: Admitting: Podiatry

## 2024-06-23 DIAGNOSIS — M79676 Pain in unspecified toe(s): Secondary | ICD-10-CM

## 2024-06-23 DIAGNOSIS — B351 Tinea unguium: Secondary | ICD-10-CM

## 2024-06-23 NOTE — Progress Notes (Signed)
 He presents today chief complaint of painful elongated toenails.  Objective: Vital signs are stable alert and oriented x 3.  Toenails are long thick yellow dystrophic like mycotic sharply incurvated and painful on palpation as well as debridement.  Assessment: Pain in limb secondary to onychomycosis and use of blood thinner.  Plan: Debrided toenails 1 through 5 bilateral.
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
## 2024-06-29 ENCOUNTER — Encounter: Payer: Self-pay | Admitting: Cardiovascular Disease

## 2024-06-29 ENCOUNTER — Ambulatory Visit: Attending: Cardiovascular Disease | Admitting: Cardiovascular Disease

## 2024-06-29 ENCOUNTER — Ambulatory Visit

## 2024-06-29 VITALS — BP 158/86 | HR 71 | Ht 72.0 in | Wt 201.0 lb

## 2024-06-29 DIAGNOSIS — I4891 Unspecified atrial fibrillation: Secondary | ICD-10-CM | POA: Diagnosis not present

## 2024-06-29 DIAGNOSIS — E782 Mixed hyperlipidemia: Secondary | ICD-10-CM

## 2024-06-29 DIAGNOSIS — I110 Hypertensive heart disease with heart failure: Secondary | ICD-10-CM

## 2024-06-29 DIAGNOSIS — I4892 Unspecified atrial flutter: Secondary | ICD-10-CM | POA: Diagnosis not present

## 2024-06-29 DIAGNOSIS — I25118 Atherosclerotic heart disease of native coronary artery with other forms of angina pectoris: Secondary | ICD-10-CM

## 2024-06-29 MED ORDER — METOPROLOL SUCCINATE ER 25 MG PO TB24: 25.0000 mg | ORAL_TABLET | Freq: Every day | ORAL | 3 refills | Status: AC

## 2024-06-29 NOTE — Patient Instructions (Addendum)
 Medication Instructions:   Please hold the amiodarone  Start metoprolol  succinate 25 mg daily  In Feb 2026, ok to hold the plavix   If you need a refill on your cardiac medications before your next appointment, please call your pharmacy.   Lab work: No new labs needed  Testing/Procedures: No new testing needed  Follow-Up: At [facility], you and your health needs are our priority.  As part of our continuing mission to provide you with exceptional heart care, we have created designated Provider Care Teams.  These Care Teams include your primary Cardiologist (physician) and Advanced Practice Providers (APPs -  Physician Assistants and Nurse Practitioners) who all work together to provide you with the care you need, when you need it.  You will need a follow up appointment in 6 months  Providers on your designated Care Team:   Lonni Meager, NP Bernardino Bring, PA-C [place]ence [place], [place]  COVID-19 Vaccine Information can be found at: [email or web address] For questions related to vaccine distribution or appointments, please email [email or web address] or call [phone].

## 2024-07-29 ENCOUNTER — Encounter: Payer: Self-pay | Admitting: Intensive Care

## 2024-07-29 ENCOUNTER — Emergency Department
Admission: EM | Admit: 2024-07-29 | Discharge: 2024-07-29 | Discharge status: Against Medical Advice | Attending: Emergency Medicine | Admitting: Emergency Medicine

## 2024-07-29 ENCOUNTER — Other Ambulatory Visit: Payer: Self-pay

## 2024-07-29 DIAGNOSIS — Z5321 Procedure and treatment not carried out due to patient leaving prior to being seen by health care provider: Secondary | ICD-10-CM | POA: Insufficient documentation

## 2024-07-29 DIAGNOSIS — R61 Generalized hyperhidrosis: Secondary | ICD-10-CM | POA: Diagnosis present

## 2024-07-29 DIAGNOSIS — I482 Chronic atrial fibrillation, unspecified: Secondary | ICD-10-CM | POA: Diagnosis not present

## 2024-07-29 DIAGNOSIS — R531 Weakness: Secondary | ICD-10-CM | POA: Insufficient documentation

## 2024-07-29 LAB — CBC
HCT: 31.6 % — ABNORMAL LOW (ref 39.0–52.0)
Hemoglobin: 10 g/dL — ABNORMAL LOW (ref 13.0–17.0)
MCH: 30.1 pg (ref 26.0–34.0)
MCHC: 31.6 g/dL (ref 30.0–36.0)
MCV: 95.2 fL (ref 80.0–100.0)
Platelets: 160 K/uL (ref 150–400)
RBC: 3.32 MIL/uL — ABNORMAL LOW (ref 4.22–5.81)
RDW: 13.6 % (ref 11.5–15.5)
WBC: 5.1 K/uL (ref 4.0–10.5)
nRBC: 0 % (ref 0.0–0.2)

## 2024-07-29 LAB — COMPREHENSIVE METABOLIC PANEL WITH GFR
ALT: 12 U/L (ref 0–44)
AST: 17 U/L (ref 15–41)
Albumin: 4.2 g/dL (ref 3.5–5.0)
Alkaline Phosphatase: 53 U/L (ref 38–126)
Anion gap: 11 (ref 5–15)
BUN: 18 mg/dL (ref 8–23)
CO2: 26 mmol/L (ref 22–32)
Calcium: 8.9 mg/dL (ref 8.9–10.3)
Chloride: 100 mmol/L (ref 98–111)
Creatinine, Ser: 0.92 mg/dL (ref 0.61–1.24)
GFR, Estimated: >60 mL/min
Glucose, Bld: 127 mg/dL — ABNORMAL HIGH (ref 70–99)
Potassium: 3.9 mmol/L (ref 3.5–5.1)
Sodium: 136 mmol/L (ref 135–145)
Total Bilirubin: 0.4 mg/dL (ref 0.0–1.2)
Total Protein: 6.7 g/dL (ref 6.5–8.1)

## 2024-07-29 LAB — PROTIME-INR
INR: 1.4 — ABNORMAL HIGH (ref 0.8–1.2)
Prothrombin Time: 17.5 s — ABNORMAL HIGH (ref 11.4–15.2)

## 2024-07-29 NOTE — ED Triage Notes (Signed)
 Patient arrived by [facility] from home after episode of feeling diaphoretic and weak this AM.   Denies N/V/D  EMS vitals: 140/89 b/p 98% RA 35-57 HR, history a-fib 98.6oral  History Amputation of Right arm

## 2024-07-29 NOTE — ED Notes (Signed)
 First Nurse Note: Pt to ED via [facility] from home for generalized weakness. Pt also reports having an episodes of sweating while eating. Pt denies pain. Pt just finished a course of amoxicillin  yesterday.   BP 140/89

## 2024-09-23 ENCOUNTER — Ambulatory Visit: Admitting: Podiatry
